# Patient Record
Sex: Female | Born: 2000 | Race: Black or African American | Hispanic: No | Marital: Single | Smoking: Never smoker
Health system: Southern US, Community
[De-identification: ages and names within clinical notes are randomized; demographics above are authoritative.]

## PROBLEM LIST (undated history)

## (undated) DIAGNOSIS — F4310 Post-traumatic stress disorder, unspecified: Secondary | ICD-10-CM

## (undated) DIAGNOSIS — G971 Other reaction to spinal and lumbar puncture: Secondary | ICD-10-CM

## (undated) DIAGNOSIS — W3400XA Accidental discharge from unspecified firearms or gun, initial encounter: Secondary | ICD-10-CM

## (undated) DIAGNOSIS — F419 Anxiety disorder, unspecified: Secondary | ICD-10-CM

## (undated) DIAGNOSIS — Y249XXA Unspecified firearm discharge, undetermined intent, initial encounter: Secondary | ICD-10-CM
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
## 2019-06-11 ENCOUNTER — Emergency Department (HOSPITAL_COMMUNITY): Payer: Medicaid Other

## 2019-06-11 ENCOUNTER — Emergency Department (HOSPITAL_COMMUNITY)
Admission: EM | Admit: 2019-06-11 | Discharge: 2019-06-11 | Disposition: A | Discharge status: Home / Self Care | Payer: Medicaid Other | Attending: Emergency Medicine | Admitting: Emergency Medicine

## 2019-06-11 ENCOUNTER — Encounter (HOSPITAL_COMMUNITY): Payer: Self-pay

## 2019-06-11 DIAGNOSIS — Z20828 Contact with and (suspected) exposure to other viral communicable diseases: Secondary | ICD-10-CM

## 2019-06-11 DIAGNOSIS — U071 COVID-19: Secondary | ICD-10-CM | POA: Insufficient documentation

## 2019-06-11 DIAGNOSIS — R509 Fever, unspecified: Secondary | ICD-10-CM | POA: Diagnosis present

## 2019-06-11 DIAGNOSIS — Z20822 Contact with and (suspected) exposure to covid-19: Secondary | ICD-10-CM

## 2019-06-11 MED ORDER — ALBUTEROL SULFATE HFA 108 (90 BASE) MCG/ACT IN AERS
4.0000 | INHALATION_SPRAY | Freq: Once | RESPIRATORY_TRACT | Status: AC
  Administered 2019-06-11: 4 via RESPIRATORY_TRACT
  Filled 2019-06-11: qty 6.7

## 2019-06-11 NOTE — ED Triage Notes (Signed)
Pt presents with c/o cough, fever, chills, SOB, loss of taste, and generalized weakness x3 days. Pt states she has taken OTC medication without relief.

## 2019-06-11 NOTE — ED Provider Notes (Signed)
[facility] EMERGENCY DEPARTMENT Provider Note   CSN: [id number] Arrival date & time: 06/11/19  0231     History   Chief Complaint Chief Complaint  Patient presents with  . Fever  . Cough  . Headache    HPI Ariana Lawrence is a 18 y.o. female who presents to ED for 3-day history of cough productive with mucus, shortness of breath, chills, chest tightness, wheezing, loss of appetite and loss of taste. She reports generalized fatigue and body aches as well.  She has tried ibuprofen with no improvement in her symptoms.  States that she works at [employer] and have been numerous sick contacts that have tested positive for COVID-19.  She had a negative COVID-19 test in August. She denies any chest pain, leg swelling, hemoptysis, history of DVT or PE, abdominal pain, vomiting.     HPI  History reviewed. No pertinent past medical history.  There are no active problems to display for this patient.   Past Surgical History:  Procedure Laterality Date  . CESAREAN SECTION       OB History   No obstetric history on file.      Home Medications    Prior to Admission medications   Not on File    Family History No family history on file.  Social History Social History   Tobacco Use  . Smoking status: Never Smoker  Substance Use Topics  . Alcohol use: Not on file  . Drug use: Not on file     Allergies   Patient has no known allergies.   Review of Systems Review of Systems  Constitutional: Positive for chills and fatigue. Negative for fever.  Respiratory: Positive for chest tightness, shortness of breath and wheezing.   Musculoskeletal: Positive for myalgias.  Neurological: Negative for weakness and numbness.     Physical Exam Updated Vital Signs BP (!) 137/100   Pulse 98   Temp 100.2 F (37.9 C) (Oral)   Resp 20   LMP 05/26/2019 (Exact Date)   SpO2 100%   Physical Exam Vitals signs and nursing note reviewed.  Constitutional:       General: She is not in acute distress.    Appearance: She is well-developed. She is not diaphoretic.     Comments: Speaking in complete sentences without difficulty.  HENT:     Head: Normocephalic and atraumatic.  Eyes:     General: No scleral icterus.    Conjunctiva/sclera: Conjunctivae normal.  Neck:     Musculoskeletal: Normal range of motion.  Cardiovascular:     Rate and Rhythm: Normal rate and regular rhythm.     Heart sounds: Normal heart sounds.  Pulmonary:     Effort: Pulmonary effort is normal. No respiratory distress.     Breath sounds: Wheezing (Bilateral lower lung fields) present.  Skin:    Findings: No rash.  Neurological:     Mental Status: She is alert.      ED Treatments / Results  Labs (all labs ordered are listed, but only abnormal results are displayed) Labs Reviewed  NOVEL CORONAVIRUS, NAA (HOSP ORDER, SEND-OUT TO REF LAB; TAT 18-24 HRS)    EKG None  Radiology Dg Chest Portable 1 View  Result Date: 06/11/2019 CLINICAL DATA:  Cough shortness of breath. EXAM: PORTABLE CHEST 1 VIEW COMPARISON:  None FINDINGS: The heart size and mediastinal contours are within normal limits. Both lungs are clear. The visualized skeletal structures are unremarkable. IMPRESSION: No acute cardiopulmonary disease. Electronically Signed  By: Zetta Bills M.D.   On: 06/11/2019 09:00    Procedures Procedures (including critical care time)  Medications Ordered in ED Medications  albuterol (VENTOLIN HFA) 108 (90 Base) MCG/ACT inhaler 4 puff (4 puffs Inhalation Given 06/11/19 9147)     Initial Impression / Assessment and Plan / ED Course  I have reviewed the triage vital signs and the nursing notes.  Pertinent labs & imaging results that were available during my care of the patient were reviewed by me and considered in my medical decision making (see chart for details).        ANIJA Lawrence was evaluated in Emergency Department on 06/11/19  for the symptoms  described in the history of present illness. He/she was evaluated in the context of the global COVID-19 pandemic, which necessitated consideration that the patient might be at risk for infection with the SARS-CoV-2 virus that causes COVID-19. Institutional protocols and algorithms that pertain to the evaluation of patients at risk for COVID-19 are in a state of rapid change based on information released by regulatory bodies including the CDC and federal and state organizations. These policies and algorithms were followed during the patient's care in the ED.  18 year old female presents to ED for 3-day history of cough productive with mucus, shortness of breath, chills, chest tightness, wheezing, loss of appetite and taste, fatigue and body aches.  Sick contacts with similar symptoms have tested positive for COVID-19.  Denies any chest pain, leg swelling, history of DVT or PE.  On exam patient is overall well-appearing.  Wheezing noted throughout bilateral lung fields without signs of respiratory distress.  She is speaking complete sentences without difficulty.  Vital signs within normal limits, she is not tachycardic, tachypneic or hypoxic.  Oral temp is 100.2 here.  She was given albuterol inhaler.  X-ray is unremarkable.  Covid test is pending.  Will encourage hydration, inhaler use, NSAIDs and Tylenol as needed to help with symptoms.  Patient is hemodynamically stable, in NAD, and able to ambulate in the ED. Evaluation does not show pathology that would require ongoing emergent intervention or inpatient treatment. I explained the diagnosis to the patient. Pain has been managed and has no complaints prior to discharge. Patient is comfortable with above plan and is stable for discharge at this time. All questions were answered prior to disposition. Strict return precautions for returning to the ED were discussed. Encouraged follow up with PCP.   An After Visit Summary was printed and given to the  patient.   Portions of this note were generated with [employer]. Dictation errors may occur despite best attempts at proofreading.   Final Clinical Impressions(s) / ED Diagnoses   Final diagnoses:  Close exposure to COVID-19 virus    ED Discharge Orders    None       Delia Heady, PA-C 06/11/19 8295    Maudie Flakes, MD 06/12/19 [phone]

## 2019-06-12 LAB — NOVEL CORONAVIRUS, NAA (HOSP ORDER, SEND-OUT TO REF LAB; TAT 18-24 HRS): SARS-CoV-2, NAA: DETECTED — AB

## 2019-11-05 ENCOUNTER — Other Ambulatory Visit (HOSPITAL_BASED_OUTPATIENT_CLINIC_OR_DEPARTMENT_OTHER): Payer: Self-pay

## 2019-11-05 DIAGNOSIS — G478 Other sleep disorders: Secondary | ICD-10-CM

## 2019-11-22 ENCOUNTER — Other Ambulatory Visit: Payer: Self-pay

## 2019-11-22 ENCOUNTER — Ambulatory Visit (INDEPENDENT_AMBULATORY_CARE_PROVIDER_SITE_OTHER): Payer: Medicaid Other | Admitting: Obstetrics and Gynecology

## 2019-11-22 ENCOUNTER — Encounter: Payer: Self-pay | Admitting: Obstetrics and Gynecology

## 2019-11-22 VITALS — BP 124/68 | HR 90 | Ht 73.0 in | Wt 308.0 lb

## 2019-11-22 DIAGNOSIS — N926 Irregular menstruation, unspecified: Secondary | ICD-10-CM

## 2019-11-22 NOTE — Progress Notes (Signed)
    GYNECOLOGY  ENCOUNTER NOTE  History:     Ariana Lawrence is a 19 y.o. Here with abnormal periods. This has been present for almost a year. G1P1; child is 2.5 y/o and she is interested in having another child. She has been trying to conceive for 5 months now. This is a different FOB. Spontaneous pregnancy with first child. C-section was the method of delivery. Says she will have a period  for 2 months in a row and then skip a period.  Periods are not heavy, periods last 4-5 days. Obesity is her sole medical condition.   Hx of chlamydia X 5.  Partner has no children.   Obstetric History OB History  No obstetric history on file.    No past medical history on file.  Past Surgical History:  Procedure Laterality Date  . CESAREAN SECTION      No current outpatient medications on file prior to visit.   No current facility-administered medications on file prior to visit.    No Known Allergies  Social History:  reports that she has never smoked. She does not have any smokeless tobacco history on file.  No family history on file.  The following portions of the patient's history were reviewed and updated as appropriate: allergies, current medications, past family history, past medical history, past social history, past surgical history and problem list.  Review of Systems Pertinent items noted in HPI and remainder of comprehensive ROS otherwise negative.  Physical Exam:  BP 124/68   Pulse 90   Ht 6\' 1"  (1.854 m)   Wt (!) 308 lb (139.7 kg)   LMP 10/09/2019 (Exact Date)   BMI 40.64 kg/m  CONSTITUTIONAL: Well-developed, well-nourished female in no acute distress.  HENT:  Normocephalic, atraumatic, External right and left ear normal. Oropharynx is clear and moist EYES: Conjunctivae and EOM are normal. Pupils are equal, round, and reactive to light. No scleral icterus.  NECK: Normal range of motion, supple, no masses.  Normal thyroid.  SKIN: Skin is warm and dry.  MUSCULOSKELETAL:  Normal range of motion. NEUROLOGIC: Alert and oriented to person, place, and time. Normal reflexes, muscle tone coordination.  PSYCHIATRIC: Normal mood and affect. Normal behavior. Normal judgment and thought content. CARDIOVASCULAR: Normal heart rate noted, regular rhythm RESPIRATORY: Clear to auscultation bilaterally. Effort and breath sounds normal, no problems with respiration noted.  Assessment and Plan:    A:  1. Abnormal menstrual periods  - Prolactin - FSH - TSH - HgB A1c - Testosterone, free - LH - Return in 2 weeks for f/u to discuss labs.   Jori Frerichs, 10/11/2019, NP [facility] [name], [facility]

## 2019-11-22 NOTE — Progress Notes (Signed)
Wants to get her cycles back on track to have another baby.  Cycles are:  05/26/2019-05/31/2019 07/02/2019-07/06/2019  08/04/2019-08/08/2019 No Cycle in Feb  10/09/2019-10/13/2019 Nothing for may late 2 days now per tracking app   [facility] stated to her she may have fibroids.

## 2019-11-23 LAB — HIV ANTIBODY (ROUTINE TESTING W REFLEX): HIV Screen 4th Generation wRfx: NONREACTIVE

## 2019-11-27 LAB — HEMOGLOBIN A1C
Est. average glucose Bld gHb Est-mCnc: 105 mg/dL
Hgb A1c MFr Bld: 5.3 % (ref 4.8–5.6)

## 2019-11-27 LAB — TSH: TSH: 1.54 u[IU]/mL (ref 0.450–4.500)

## 2019-11-27 LAB — TESTOSTERONE, FREE: Testosterone, Free: 5.2 pg/mL — ABNORMAL HIGH (ref 0.0–4.2)

## 2019-11-27 LAB — LUTEINIZING HORMONE: LH: 17.4 m[IU]/mL

## 2019-11-27 LAB — PROLACTIN: Prolactin: 9.8 ng/mL (ref 4.8–23.3)

## 2019-11-27 LAB — FOLLICLE STIMULATING HORMONE: FSH: 7.9 m[IU]/mL

## 2019-11-28 ENCOUNTER — Other Ambulatory Visit (HOSPITAL_COMMUNITY): Admission: RE | Admit: 2019-11-28 | Payer: Medicaid Other | Source: Ambulatory Visit

## 2019-11-30 ENCOUNTER — Ambulatory Visit (HOSPITAL_BASED_OUTPATIENT_CLINIC_OR_DEPARTMENT_OTHER): Payer: Medicaid Other | Admitting: Internal Medicine

## 2019-12-06 ENCOUNTER — Ambulatory Visit (INDEPENDENT_AMBULATORY_CARE_PROVIDER_SITE_OTHER): Payer: Medicaid Other | Admitting: Obstetrics and Gynecology

## 2019-12-06 ENCOUNTER — Encounter: Payer: Self-pay | Admitting: Obstetrics and Gynecology

## 2019-12-06 ENCOUNTER — Other Ambulatory Visit: Payer: Self-pay

## 2019-12-06 VITALS — BP 134/71 | HR 76 | Ht 73.0 in | Wt 309.0 lb

## 2019-12-06 DIAGNOSIS — Z6841 Body Mass Index (BMI) 40.0 and over, adult: Secondary | ICD-10-CM | POA: Diagnosis not present

## 2019-12-06 DIAGNOSIS — N926 Irregular menstruation, unspecified: Secondary | ICD-10-CM

## 2019-12-06 DIAGNOSIS — E282 Polycystic ovarian syndrome: Secondary | ICD-10-CM | POA: Diagnosis not present

## 2019-12-06 MED ORDER — MEDROXYPROGESTERONE ACETATE 10 MG PO TABS: 10.0000 mg | ORAL_TABLET | Freq: Every day | ORAL | 0 refills | Status: DC

## 2019-12-06 NOTE — Patient Instructions (Signed)
Polycystic Ovarian Syndrome  Polycystic ovarian syndrome (PCOS) is a common hormonal disorder among women of reproductive age. In most women with PCOS, many small fluid-filled sacs (cysts) grow on the ovaries, and the cysts are not part of a normal menstrual cycle. PCOS can cause problems with your menstrual periods and make it difficult to get pregnant. It can also cause an increased risk of miscarriage with pregnancy. If it is not treated, PCOS can lead to serious health problems, such as diabetes and heart disease. What are the causes? The cause of PCOS is not known, but it may be the result of a combination of certain factors, such as:  Irregular menstrual cycle.  High levels of certain hormones (androgens).  Problems with the hormone that helps to control blood sugar (insulin resistance).  Certain genes. What increases the risk? This condition is more likely to develop in women who have a family history of PCOS. What are the signs or symptoms? Symptoms of PCOS may include:  Multiple ovarian cysts.  Infrequent periods or no periods.  Periods that are too frequent or too heavy.  Unpredictable periods.  Inability to get pregnant (infertility) because of not ovulating.  Increased growth of hair on the face, chest, stomach, back, thumbs, thighs, or toes.  Acne or oily skin. Acne may develop during adulthood, and it may not respond to treatment.  Pelvic pain.  Weight gain or obesity.  Patches of thickened and dark brown or black skin on the neck, arms, breasts, or thighs (acanthosis nigricans).  Excess hair growth on the face, chest, abdomen, or upper thighs (hirsutism). How is this diagnosed? This condition is diagnosed based on:  Your medical history.  A physical exam, including a pelvic exam. Your health care provider may look for areas of increased hair growth on your skin.  Tests, such as: ? Ultrasound. This may be used to examine the ovaries and the lining of the  uterus (endometrium) for cysts. ? Blood tests. These may be used to check levels of sugar (glucose), female hormone (testosterone), and female hormones (estrogen and progesterone) in your blood. How is this treated? There is no cure for PCOS, but treatment can help to manage symptoms and prevent more health problems from developing. Treatment varies depending on:  Your symptoms.  Whether you want to have a baby or whether you need birth control (contraception). Treatment may include nutrition and lifestyle changes along with:  Progesterone hormone to start a menstrual period.  Birth control pills to help you have regular menstrual periods.  Medicines to make you ovulate, if you want to get pregnant.  Medicine to reduce excessive hair growth.  Surgery, in severe cases. This may involve making small holes in one or both of your ovaries. This decreases the amount of testosterone that your body produces. Follow these instructions at home:  Take over-the-counter and prescription medicines only as told by your health care provider.  Follow a healthy meal plan. This can help you reduce the effects of PCOS. ? Eat a healthy diet that includes lean proteins, complex carbohydrates, fresh fruits and vegetables, low-fat dairy products, and healthy fats. Make sure to eat enough fiber.  If you are overweight, lose weight as told by your health care provider. ? Losing 10% of your body weight may improve symptoms. ? Your health care provider can determine how much weight loss is best for you and can help you lose weight safely.  Keep all follow-up visits as told by your health care provider.   This is important. Contact a health care provider if:  Your symptoms do not get better with medicine.  You develop new symptoms. This information is not intended to replace advice given to you by your health care provider. Make sure you discuss any questions you have with your health care provider. Document  Revised: 06/10/2017 Document Reviewed: 12/14/2015 Elsevier Patient Education  2020 Elsevier Inc.  

## 2019-12-06 NOTE — Progress Notes (Signed)
    GYNECOLOGY ENCOUNTER NOTE  History:     Ariana Lawrence is a 19 y.o. here for f/u from recent lab work for abnormal menstrual cycle. Labs reviewed with patient.  Last period was March 30. Periods have been sporadic and irregular.  She has some abnormal hair growth on chin and face. Labs and symptoms consistent with PCOS.   LH: 17.4 Testosterone, Free: 5.2 A1C: 5.3 TSH: 1.54 FSH: 7.9 Prolactin: 9.8  Obstetric History OB History  No obstetric history on file.    No past medical history on file.  Past Surgical History:  Procedure Laterality Date  . CESAREAN SECTION      Current Outpatient Medications on File Prior to Visit  Medication Sig Dispense Refill  . Prenatal Vit-Fe Fumarate-FA (MULTIVITAMIN-PRENATAL) 27-0.8 MG TABS tablet Take 1 tablet by mouth daily at 12 noon.     No current facility-administered medications on file prior to visit.    No Known Allergies  Social History:  reports that she has never smoked. She has never used smokeless tobacco. She reports that she does not drink alcohol or use drugs.  No family history on file.  The following portions of the patient's history were reviewed and updated as appropriate: allergies, current medications, past family history, past medical history, past social history, past surgical history and problem list.  Review of Systems Pertinent items noted in HPI and remainder of comprehensive ROS otherwise negative.  Physical Exam:  BP 134/71   Pulse 76   Ht 6\' 1"  (1.854 m)   Wt (!) 309 lb (140.2 kg)   LMP 10/09/2019 (Exact Date)   BMI 40.77 kg/m  CONSTITUTIONAL: Well-developed, well-nourished female in no acute distress.  HENT:  Normocephalic, atraumatic, External right and left ear normal. Oropharynx is clear and moist EYES: Conjunctivae and EOM are normal. Pupils are equal, round, and reactive to light. No scleral icterus.  NECK: Normal range of motion, supple, no masses.  Normal thyroid.  SKIN: Skin is warm and  dry. No rash noted. Not diaphoretic. No erythema. No pallor. MUSCULOSKELETAL: Normal range of motion. No tenderness.  No cyanosis, clubbing, or edema.  2+ distal pulses. NEUROLOGIC: Alert and oriented to person, place, and time.  Assessment and Plan:   A:  Likely PCOS Elevated testosterone Obesity   P:  Provera 10 mg x 10 days  Discussed weight loss and healthy eating/exercise. Reviewed app on phone to help count calories and keep track of intake.  If no withdrawal bleed will refer to infertility.     Routine preventative health maintenance measures emphasized. Please refer to After Visit Summary for other counseling recommendations.    Ariana Lawrence, 10/11/2019, NP Ariana Lawrence, Ariana Lawrence

## 2020-01-07 ENCOUNTER — Encounter: Payer: Self-pay | Admitting: Obstetrics and Gynecology

## 2020-01-07 ENCOUNTER — Ambulatory Visit (INDEPENDENT_AMBULATORY_CARE_PROVIDER_SITE_OTHER): Payer: Medicaid Other | Admitting: Obstetrics and Gynecology

## 2020-01-07 ENCOUNTER — Other Ambulatory Visit: Payer: Self-pay

## 2020-01-07 VITALS — BP 133/81 | HR 74 | Wt 313.8 lb

## 2020-01-07 DIAGNOSIS — N926 Irregular menstruation, unspecified: Secondary | ICD-10-CM

## 2020-01-07 DIAGNOSIS — Z6841 Body Mass Index (BMI) 40.0 and over, adult: Secondary | ICD-10-CM

## 2020-01-07 DIAGNOSIS — E282 Polycystic ovarian syndrome: Secondary | ICD-10-CM

## 2020-01-07 DIAGNOSIS — E66813 Obesity, class 3: Secondary | ICD-10-CM

## 2020-01-07 NOTE — Progress Notes (Signed)
    GYNECOLOGY ENCOUNTER NOTE  History:     Ariana Lawrence is a 19 y.o. G65P2001 female here for follow up from previous visit for abnormal menstrual cycles, PCOS, and obeseity. The patient desires another pregnancy. She was given provera X 10 days at previous visit. She had a withdrawal bleeding from 6/8-6/11.  She is continuing to follow a weight loss program.She is doing [employer] bites on her iphone which is keeping her accountable for caloric intake. She is determined to lose weight.   Gynecologic History Patient's last menstrual period was 12/18/2019 (exact date). Contraception: none  Obstetric History OB History  Gravida Para Term Preterm AB Living  2 2 2  0   1  SAB TAB Ectopic Multiple Live Births  0 0 0 0 1    # Outcome Date GA Lbr Len/2nd Weight Sex Delivery Anes PTL Lv  2 Term 05/10/16          1 Term             No past medical history on file.  Past Surgical History:  Procedure Laterality Date  . CESAREAN SECTION      Current Outpatient Medications on File Prior to Visit  Medication Sig Dispense Refill  . cholecalciferol (VITAMIN D3) 25 MCG (1000 UNIT) tablet Take 1,000 Units by mouth daily.    . Prenatal Vit-Fe Fumarate-FA (MULTIVITAMIN-PRENATAL) 27-0.8 MG TABS tablet Take 1 tablet by mouth daily at 12 noon.     No current facility-administered medications on file prior to visit.    No Known Allergies  Social History:  reports that she has never smoked. She has never used smokeless tobacco. She reports that she does not drink alcohol and does not use drugs.  No family history on file.  The following portions of the patient's history were reviewed and updated as appropriate: allergies, current medications, past family history, past medical history, past social history, past surgical history and problem list.  Review of Systems Pertinent items noted in HPI and remainder of comprehensive ROS otherwise negative.  Physical Exam:  BP 133/81   Pulse 74   Wt (!)  313 lb 12.8 oz (142.3 kg)   LMP 12/18/2019 (Exact Date)   BMI 41.40 kg/m  CONSTITUTIONAL: Well-developed, well-nourished female in no acute distress.  HENT:  Normocephalic EYES: Conjunctivae and EOM are normal.  MUSCULOSKELETAL: Normal range of motion. NEUROLOGIC: Alert and oriented to person, place, and time.   PSYCHIATRIC: Normal mood and affect. Normal behavior.   Assessment and Plan:   1. Abnormal menstrual periods  Had withdrawal bleed with provera   2. Class 3 severe obesity without serious comorbidity with body mass index (BMI) of 40.0 to 44.9 in adult, unspecified obesity type [facility])  Information given to [facility] weight and wellness.  Continue weight loss plan.   3. PCOS (polycystic ovarian syndrome)  BMI 41 Weight increased from 308 to 313 F/u in 3-6 months. Would consider glucose testing and trial of metformin. Encouraged weight loss as primary goal. Weight goal 300 lbs at this time.    Ariana Lawrence, [facility], NP [facility] for Ariana Lawrence, [facility]

## 2020-01-07 NOTE — Patient Instructions (Signed)
Ariana Lawrence tel:[phone]

## 2020-03-09 ENCOUNTER — Encounter (HOSPITAL_COMMUNITY): Payer: Self-pay

## 2020-03-09 ENCOUNTER — Other Ambulatory Visit: Payer: Self-pay

## 2020-03-09 ENCOUNTER — Ambulatory Visit (HOSPITAL_COMMUNITY)
Admission: EM | Admit: 2020-03-09 | Discharge: 2020-03-09 | Disposition: A | Discharge status: Home / Self Care | Payer: Medicaid Other | Attending: Family Medicine | Admitting: Family Medicine

## 2020-03-09 DIAGNOSIS — J029 Acute pharyngitis, unspecified: Secondary | ICD-10-CM | POA: Diagnosis present

## 2020-03-09 DIAGNOSIS — Z79899 Other long term (current) drug therapy: Secondary | ICD-10-CM | POA: Insufficient documentation

## 2020-03-09 DIAGNOSIS — Z20822 Contact with and (suspected) exposure to covid-19: Secondary | ICD-10-CM | POA: Insufficient documentation

## 2020-03-09 LAB — POCT RAPID STREP A, ED / UC: Streptococcus, Group A Screen (Direct): NEGATIVE

## 2020-03-09 LAB — SARS CORONAVIRUS 2 (TAT 6-24 HRS): SARS Coronavirus 2: NEGATIVE

## 2020-03-09 MED ORDER — AMOXICILLIN-POT CLAVULANATE 875-125 MG PO TABS: 1.0000 | ORAL_TABLET | Freq: Two times a day (BID) | ORAL | 0 refills | Status: DC

## 2020-03-09 MED ORDER — LORATADINE 10 MG PO TABS: 10.0000 mg | ORAL_TABLET | Freq: Every day | ORAL | 0 refills | Status: DC

## 2020-03-09 NOTE — ED Triage Notes (Signed)
Pt presents with complaints of sore throat x 2 weeks. Denies any fevers. Reports minimal relief with otc treatment. Requesting covid test.

## 2020-03-09 NOTE — ED Provider Notes (Signed)
[facility]    CSN: [id number] Arrival date & time: 03/09/20  1022      History   Chief Complaint Chief Complaint  Patient presents with  . Sore    HPI Ariana Lawrence is a 19 y.o. female.   HPI  Patient has a sore throat for 2 weeks.  Tonsils are swollen.  Painful with swallowing.  No runny nose or stuffy nose or coughing.  No sweats chills or fever.  No headache or body aches.  No nausea or vomiting.  Does not usually have allergies or postnasal drip.  Denies history of acid reflux.  No known exposure to Covid.  No known exposure to strep  History reviewed. No pertinent past medical history.  There are no problems to display for this patient.   Past Surgical History:  Procedure Laterality Date  . CESAREAN SECTION      OB History    Gravida  2   Para  2   Term  2   Preterm  0   AB      Living  1     SAB  0   TAB  0   Ectopic  0   Multiple  0   Live Births  1            Home Medications    Prior to Admission medications   Medication Sig Start Date End Date Taking? Authorizing Provider  amoxicillin-clavulanate (AUGMENTIN) 875-125 MG tablet Take 1 tablet by mouth every 12 (twelve) hours. 03/09/20   Eustace Moore, MD  cholecalciferol (VITAMIN D3) 25 MCG (1000 UNIT) tablet Take 1,000 Units by mouth daily.    [provider]  loratadine (CLARITIN) 10 MG tablet Take 1 tablet (10 mg total) by mouth daily. 03/09/20   Eustace Moore, MD  Prenatal Vit-Fe Fumarate-FA (MULTIVITAMIN-PRENATAL) 27-0.8 MG TABS tablet Take 1 tablet by mouth daily at 12 noon.    [provider]    Family History Family History  Problem Relation Age of Onset  . Healthy Mother   . Heart attack Father   . Hepatitis Father     Social History Social History   Tobacco Use  . Smoking status: Never Smoker  . Smokeless tobacco: Never Used  Substance Use Topics  . Alcohol use: Never  . Drug use: Never     Allergies   Patient has no  known allergies.   Review of Systems Review of Systems  See HPI Physical Exam Triage Vital Signs ED Triage Vitals  Enc Vitals Group     BP 03/09/20 1143 131/77     Pulse Rate 03/09/20 1143 69     Resp 03/09/20 1143 19     Temp 03/09/20 1143 98.1 F (36.7 C)     Temp src --      SpO2 03/09/20 1143 100 %     Weight --      Height --      Head Circumference --      Peak Flow --      Pain Score 03/09/20 1140 7     Pain Loc --      Pain Edu? --      Excl. in GC? --    No data found.  Updated Vital Signs BP 131/77   Pulse 69   Temp 98.1 F (36.7 C)   Resp 19   LMP 02/08/2020   SpO2 100%      Physical Exam Constitutional:  General: She is not in acute distress.    Appearance: She is well-developed.  HENT:     Head: Normocephalic and atraumatic.     Right Ear: Tympanic membrane and ear canal normal.     Left Ear: Tympanic membrane and ear canal normal.     Nose: Nose normal. No congestion.     Mouth/Throat:     Pharynx: Posterior oropharyngeal erythema present.     Comments: Tonsils are enlarged to 3+ bilaterally.  No exudate.  Moderate erythema.  Clear PND Eyes:     Conjunctiva/sclera: Conjunctivae normal.     Pupils: Pupils are equal, round, and reactive to light.  Neck:     Comments: Anterior cervical nodes.  No posterior Cardiovascular:     Rate and Rhythm: Normal rate.     Heart sounds: Normal heart sounds.  Pulmonary:     Effort: Pulmonary effort is normal. No respiratory distress.     Breath sounds: Normal breath sounds.  Musculoskeletal:        General: Normal range of motion.     Cervical back: Normal range of motion.  Lymphadenopathy:     Cervical: Cervical adenopathy present.  Skin:    General: Skin is warm and dry.  Neurological:     Mental Status: She is alert.  Psychiatric:        Behavior: Behavior normal.      UC Treatments / Results  Labs (all labs ordered are listed, but only abnormal results are displayed) Labs Reviewed   SARS CORONAVIRUS 2 (TAT 6-24 HRS)  CULTURE, GROUP A STREP [facility])  POCT RAPID STREP A, ED / UC    EKG   Radiology No results found.  Procedures Procedures (including critical care time)  Medications Ordered in UC Medications - No data to display  Initial Impression / Assessment and Plan / UC Course  I have reviewed the triage vital signs and the nursing notes.  Pertinent labs & imaging results that were available during my care of the patient were reviewed by me and considered in my medical decision making (see chart for details).     Concern for appearance of tonsils with 2 weeks of sore throat.  Even though strep test is negative and will treat her with Augmentin.  Advise a probiotic while on Augmentin.  Pain management discussed.  I am giving her Claritin because I did see some clear PND.  Return as needed Final Clinical Impressions(s) / UC Diagnoses   Final diagnoses:  Sore throat     Discharge Instructions     Most sore throats are caused by a virus.  A virus should be gone within 10 days.  Because your sore throat is persisting I am going to treat you with antibiotics. Take the antibiotic 1 pill 2 times a day I recommend you take a probiotic while taking antibiotics to protect your stomach I have also prescribed Claritin to take once a day.  This will prevent any postnasal drip that irritates your throat Return if not improved in several days Check my chart for your throat culture and Covid test results    ED Prescriptions    Medication Sig Dispense Auth. Provider   loratadine (CLARITIN) 10 MG tablet Take 1 tablet (10 mg total) by mouth daily. 30 tablet Eustace Moore, MD   amoxicillin-clavulanate (AUGMENTIN) 875-125 MG tablet Take 1 tablet by mouth every 12 (twelve) hours. 14 tablet Eustace Moore, MD     PDMP not reviewed this encounter.  Eustace Moore, MD 03/09/20 [phone]

## 2020-03-09 NOTE — Discharge Instructions (Addendum)
Most sore throats are caused by a virus.  A virus should be gone within 10 days.  Because your sore throat is persisting I am going to treat you with antibiotics. Take the antibiotic 1 pill 2 times a day I recommend you take a probiotic while taking antibiotics to protect your stomach I have also prescribed Claritin to take once a day.  This will prevent any postnasal drip that irritates your throat Return if not improved in several days Check my chart for your throat culture and Covid test results

## 2020-03-11 LAB — CULTURE, GROUP A STREP (THRC)

## 2020-04-11 ENCOUNTER — Ambulatory Visit (HOSPITAL_COMMUNITY)
Admission: EM | Admit: 2020-04-11 | Discharge: 2020-04-11 | Disposition: A | Discharge status: Home / Self Care | Payer: Medicaid Other | Attending: Family Medicine | Admitting: Family Medicine

## 2020-04-11 ENCOUNTER — Encounter (HOSPITAL_COMMUNITY): Payer: Self-pay

## 2020-04-11 ENCOUNTER — Other Ambulatory Visit: Payer: Self-pay

## 2020-04-11 DIAGNOSIS — R109 Unspecified abdominal pain: Secondary | ICD-10-CM

## 2020-04-11 DIAGNOSIS — R110 Nausea: Secondary | ICD-10-CM | POA: Insufficient documentation

## 2020-04-11 DIAGNOSIS — R1013 Epigastric pain: Secondary | ICD-10-CM | POA: Insufficient documentation

## 2020-04-11 DIAGNOSIS — Z3202 Encounter for pregnancy test, result negative: Secondary | ICD-10-CM | POA: Diagnosis not present

## 2020-04-11 DIAGNOSIS — R197 Diarrhea, unspecified: Secondary | ICD-10-CM | POA: Insufficient documentation

## 2020-04-11 LAB — POCT URINALYSIS DIPSTICK, ED / UC
Bilirubin Urine: NEGATIVE
Glucose, UA: NEGATIVE mg/dL
Hgb urine dipstick: NEGATIVE
Ketones, ur: NEGATIVE mg/dL
Leukocytes,Ua: NEGATIVE
Nitrite: NEGATIVE
Protein, ur: NEGATIVE mg/dL
Specific Gravity, Urine: >=1.03 (ref 1.005–1.030)
Urobilinogen, UA: 0.2 mg/dL (ref 0.0–1.0)
pH: 5.5 (ref 5.0–8.0)

## 2020-04-11 LAB — POC URINE PREG, ED: Preg Test, Ur: NEGATIVE

## 2020-04-11 MED ORDER — ESOMEPRAZOLE MAGNESIUM 40 MG PO CPDR: 40.0000 mg | DELAYED_RELEASE_CAPSULE | Freq: Every day | ORAL | 0 refills | Status: DC

## 2020-04-11 MED ORDER — ONDANSETRON 4 MG PO TBDP: 4.0000 mg | ORAL_TABLET | Freq: Three times a day (TID) | ORAL | 0 refills | Status: DC | PRN

## 2020-04-11 NOTE — ED Triage Notes (Signed)
Pt c/o cramps in abdomen, diarrhea, and reflux that started approx 2 weeks ago after she ate seafood. Patient feels as though she has to vomit but is not able to. C/o medial tight chest pain that started on Tuesday. Pt tried BRAT diet to help bulk up stools Pt also reports taking stool softeners, last dose on Tuesday Has been having difficulty with urinating for almost 4 days with no other sx Denies sob, congestion, runny nose, congestion, fever Took augmentin in august for possible sore throat

## 2020-04-11 NOTE — ED Provider Notes (Addendum)
[facility]   [id number] 04/11/20 Arrival Time: 1039  ASSESSMENT & PLAN:  1. Dyspepsia   2. Nausea   3. Diarrhea, unspecified type     Stool sample collected for PCR.  Meds ordered this encounter  Medications  . esomeprazole (NEXIUM) 40 MG capsule    Sig: Take 1 capsule (40 mg total) by mouth daily.    Dispense:  30 capsule    Refill:  0  . ondansetron (ZOFRAN-ODT) 4 MG disintegrating tablet    Sig: Take 1 tablet (4 mg total) by mouth every 8 (eight) hours as needed for nausea or vomiting.    Dispense:  15 tablet    Refill:  0    Will do her best to ensure adequate fluid intake in order to avoid dehydration. Will proceed to the Emergency Department for evaluation if unable to tolerate PO fluids regularly.  Otherwise she will f/u with her PCP or here if not showing improvement over the next 48-72 hours.  Reviewed expectations re: course of current medical issues. Questions answered. Outlined signs and symptoms indicating need for more acute intervention. Patient verbalized understanding. After Visit Summary given.   SUBJECTIVE: History from: patient.  Ariana Lawrence is a 19 y.o. female who presents with complaint of non-bilious, non-bloody intermittent nausea with non-bloody diarrhea. Onset a week ago. Abdominal discomfort: mild and cramping. Symptoms are stable since beginning. Aggravating factors: none. Alleviating factors: none. Reports acid reflux, belching more often. Associated symptoms: fatigue; midl. She denies dysuria and fever. Appetite: mostly normal except for when nausea is present. PO intake: normal. Ambulatory without assistance. Urinary symptoms: none. Sick contacts: none. Recent travel or camping: none. OTC treatment: none.  Patient's last menstrual period was 03/24/2020 (exact date).  Past Surgical History:  Procedure Laterality Date  . CESAREAN SECTION       OBJECTIVE:  Vitals:   04/11/20 1312 04/11/20 1313  BP: 136/80   Pulse: 85    Resp: 19   Temp: 98.5 F (36.9 C)   TempSrc: Oral   SpO2: 100% 100%    General appearance: alert; no distress Oropharynx: moist Lungs: speaks full sentences without difficulty; unlabored Heart: regular rate and rhythm Abdomen: soft; non-distended Extremities: no edema; symmetrical with no gross deformities Skin: warm; dry Neurologic: normal gait Psychological: alert and cooperative; normal mood and affect  Labs: Results for orders placed or performed during the hospital encounter of 04/11/20  POC Urinalysis dipstick  Result Value Ref Range   Glucose, UA NEGATIVE NEGATIVE mg/dL   Bilirubin Urine NEGATIVE NEGATIVE   Ketones, ur NEGATIVE NEGATIVE mg/dL   Specific Gravity, Urine >=1.030 1.005 - 1.030   Hgb urine dipstick NEGATIVE NEGATIVE   pH 5.5 5.0 - 8.0   Protein, ur NEGATIVE NEGATIVE mg/dL   Urobilinogen, UA 0.2 0.0 - 1.0 mg/dL   Nitrite NEGATIVE NEGATIVE   Leukocytes,Ua NEGATIVE NEGATIVE  POC urine pregnancy  Result Value Ref Range   Preg Test, Ur NEGATIVE NEGATIVE     No Known Allergies                                             History reviewed. No pertinent past medical history.   Social History   Socioeconomic History  . Marital status: Single    Spouse name: Not on file  . Number of children: Not on file  . Years of education:  Not on file  . Highest education level: Not on file  Occupational History  . Not on file  Tobacco Use  . Smoking status: Never Smoker  . Smokeless tobacco: Never Used  Substance and Sexual Activity  . Alcohol use: Never  . Drug use: Never  . Sexual activity: Not on file  Other Topics Concern  . Not on file  Social History Narrative  . Not on file   Social Determinants of Health   Financial Resource Strain:   . Difficulty of Paying Living Expenses: Not on file  Food Insecurity: Food Insecurity Present  . Worried About Programme researcher, broadcasting/film/video in the Last Year: Sometimes true  . Ran Out of Food in the Last Year: Sometimes  true  Transportation Needs: No Transportation Needs  . Lack of Transportation (Medical): No  . Lack of Transportation (Non-Medical): No  Physical Activity:   . Days of Exercise per Week: Not on file  . Minutes of Exercise per Session: Not on file  Stress:   . Feeling of Stress : Not on file  Social Connections:   . Frequency of Communication with Friends and Family: Not on file  . Frequency of Social Gatherings with Friends and Family: Not on file  . Attends Religious Services: Not on file  . Active Member of Clubs or Organizations: Not on file  . Attends Banker Meetings: Not on file  . Marital Status: Not on file  Intimate Partner Violence:   . Fear of Current or Ex-Partner: Not on file  . Emotionally Abused: Not on file  . Physically Abused: Not on file  . Sexually Abused: Not on file   Family History  Problem Relation Age of Onset  . Healthy Mother   . Heart attack Father   . Hepatitis Father      Mardella Layman, MD 04/11/20 1531    Mardella Layman, MD 04/11/20 [phone]

## 2020-04-11 NOTE — Discharge Instructions (Signed)
Please do your best to ensure adequate fluid intake in order to avoid dehydration. If you find that you are unable to tolerate drinking fluids regularly please proceed to the Emergency Department for evaluation.  Also, you should return to the hospital if you experience persistent fevers for greater than 1-2 days, increasing abdominal pain that persists despite medications, persistent diarrhea, dizziness, syncope (fainting), or for any other concerns you may deem worrisome.

## 2020-04-12 LAB — GASTROINTESTINAL PANEL BY PCR, STOOL (REPLACES STOOL CULTURE)

## 2020-04-28 ENCOUNTER — Encounter (HOSPITAL_COMMUNITY): Admission: EM | Disposition: A | Discharge status: Home / Self Care | Payer: Self-pay | Source: Home / Self Care

## 2020-04-28 ENCOUNTER — Emergency Department (HOSPITAL_COMMUNITY): Payer: Medicaid Other

## 2020-04-28 ENCOUNTER — Inpatient Hospital Stay (HOSPITAL_COMMUNITY): Payer: Medicaid Other | Admitting: Certified Registered"

## 2020-04-28 ENCOUNTER — Emergency Department (HOSPITAL_COMMUNITY): Payer: Medicaid Other | Admitting: Anesthesiology

## 2020-04-28 ENCOUNTER — Encounter (HOSPITAL_COMMUNITY): Payer: Self-pay | Admitting: Certified Registered Nurse Anesthetist

## 2020-04-28 ENCOUNTER — Inpatient Hospital Stay (HOSPITAL_COMMUNITY)
Admission: EM | Admit: 2020-04-28 | Discharge: 2020-05-03 | DRG: 140 | Disposition: A | Discharge status: Home / Self Care | Payer: Medicaid Other | Attending: General Surgery | Admitting: General Surgery

## 2020-04-28 ENCOUNTER — Encounter (HOSPITAL_COMMUNITY): Payer: Self-pay

## 2020-04-28 ENCOUNTER — Inpatient Hospital Stay (HOSPITAL_COMMUNITY): Payer: Medicaid Other

## 2020-04-28 DIAGNOSIS — W3400XA Accidental discharge from unspecified firearms or gun, initial encounter: Secondary | ICD-10-CM

## 2020-04-28 DIAGNOSIS — S0292XB Unspecified fracture of facial bones, initial encounter for open fracture: Secondary | ICD-10-CM

## 2020-04-28 DIAGNOSIS — T07XXXA Unspecified multiple injuries, initial encounter: Secondary | ICD-10-CM

## 2020-04-28 DIAGNOSIS — T1490XA Injury, unspecified, initial encounter: Secondary | ICD-10-CM

## 2020-04-28 DIAGNOSIS — Z4659 Encounter for fitting and adjustment of other gastrointestinal appliance and device: Secondary | ICD-10-CM

## 2020-04-28 DIAGNOSIS — S1093XA Contusion of unspecified part of neck, initial encounter: Secondary | ICD-10-CM

## 2020-04-28 DIAGNOSIS — Z9289 Personal history of other medical treatment: Secondary | ICD-10-CM

## 2020-04-28 DIAGNOSIS — Z23 Encounter for immunization: Secondary | ICD-10-CM

## 2020-04-28 DIAGNOSIS — Z9911 Dependence on respirator [ventilator] status: Secondary | ICD-10-CM | POA: Diagnosis not present

## 2020-04-28 DIAGNOSIS — R200 Anesthesia of skin: Secondary | ICD-10-CM | POA: Diagnosis present

## 2020-04-28 DIAGNOSIS — Y249XXA Unspecified firearm discharge, undetermined intent, initial encounter: Secondary | ICD-10-CM

## 2020-04-28 DIAGNOSIS — J9690 Respiratory failure, unspecified, unspecified whether with hypoxia or hypercapnia: Secondary | ICD-10-CM | POA: Diagnosis present

## 2020-04-28 DIAGNOSIS — S27329A Contusion of lung, unspecified, initial encounter: Secondary | ICD-10-CM | POA: Diagnosis present

## 2020-04-28 DIAGNOSIS — Z20822 Contact with and (suspected) exposure to covid-19: Secondary | ICD-10-CM | POA: Diagnosis present

## 2020-04-28 DIAGNOSIS — Z6841 Body Mass Index (BMI) 40.0 and over, adult: Secondary | ICD-10-CM | POA: Diagnosis not present

## 2020-04-28 DIAGNOSIS — S02601B Fracture of unspecified part of body of right mandible, initial encounter for open fracture: Secondary | ICD-10-CM | POA: Diagnosis present

## 2020-04-28 DIAGNOSIS — S01512A Laceration without foreign body of oral cavity, initial encounter: Secondary | ICD-10-CM | POA: Diagnosis present

## 2020-04-28 DIAGNOSIS — R600 Localized edema: Secondary | ICD-10-CM | POA: Diagnosis present

## 2020-04-28 HISTORY — PX: ORIF FACIAL FRACTURE: SHX2118

## 2020-04-28 LAB — I-STAT CHEM 8, ED
BUN: 10 mg/dL (ref 6–20)
Calcium, Ion: 1.17 mmol/L (ref 1.15–1.40)
Chloride: 105 mmol/L (ref 98–111)
Creatinine, Ser: 0.9 mg/dL (ref 0.44–1.00)
Glucose, Bld: 117 mg/dL — ABNORMAL HIGH (ref 70–99)
HCT: 38 % (ref 36.0–46.0)
Hemoglobin: 12.9 g/dL (ref 12.0–15.0)
Potassium: 4 mmol/L (ref 3.5–5.1)
Sodium: 139 mmol/L (ref 135–145)
TCO2: 23 mmol/L (ref 22–32)

## 2020-04-28 LAB — CBC
HCT: 40.1 % (ref 36.0–46.0)
Hemoglobin: 12.7 g/dL (ref 12.0–15.0)
MCH: 28.7 pg (ref 26.0–34.0)
MCHC: 31.7 g/dL (ref 30.0–36.0)
MCV: 90.5 fL (ref 80.0–100.0)
Platelets: 321 10*3/uL (ref 150–400)
RBC: 4.43 MIL/uL (ref 3.87–5.11)
RDW: 13 % (ref 11.5–15.5)
WBC: 17 10*3/uL — ABNORMAL HIGH (ref 4.0–10.5)
nRBC: 0 % (ref 0.0–0.2)

## 2020-04-28 LAB — URINALYSIS, ROUTINE W REFLEX MICROSCOPIC
Bilirubin Urine: NEGATIVE
Glucose, UA: NEGATIVE mg/dL
Hgb urine dipstick: NEGATIVE
Ketones, ur: NEGATIVE mg/dL
Leukocytes,Ua: NEGATIVE
Nitrite: NEGATIVE
Protein, ur: 30 mg/dL — AB
Specific Gravity, Urine: 1.045 — ABNORMAL HIGH (ref 1.005–1.030)
pH: 6 (ref 5.0–8.0)

## 2020-04-28 LAB — COMPREHENSIVE METABOLIC PANEL
ALT: 17 U/L (ref 0–44)
AST: 19 U/L (ref 15–41)
Albumin: 3.5 g/dL (ref 3.5–5.0)
Alkaline Phosphatase: 63 U/L (ref 38–126)
Anion gap: 11 (ref 5–15)
BUN: 9 mg/dL (ref 6–20)
CO2: 20 mmol/L — ABNORMAL LOW (ref 22–32)
Calcium: 8.9 mg/dL (ref 8.9–10.3)
Chloride: 105 mmol/L (ref 98–111)
Creatinine, Ser: 0.96 mg/dL (ref 0.44–1.00)
GFR, Estimated: >60 mL/min (ref >60)
Glucose, Bld: 119 mg/dL — ABNORMAL HIGH (ref 70–99)
Potassium: 4.1 mmol/L (ref 3.5–5.1)
Sodium: 136 mmol/L (ref 135–145)
Total Bilirubin: 0.7 mg/dL (ref 0.3–1.2)
Total Protein: 6.6 g/dL (ref 6.5–8.1)

## 2020-04-28 LAB — RESPIRATORY PANEL BY RT PCR (FLU A&B, COVID)
Influenza A by PCR: NEGATIVE
Influenza B by PCR: NEGATIVE
SARS Coronavirus 2 by RT PCR: NEGATIVE

## 2020-04-28 LAB — PREPARE PLATELET PHERESIS: Unit division: 0

## 2020-04-28 LAB — POCT I-STAT 7, (LYTES, BLD GAS, ICA,H+H)
Acid-Base Excess: 0 mmol/L (ref 0.0–2.0)
Bicarbonate: 23.8 mmol/L (ref 20.0–28.0)
Calcium, Ion: 1.18 mmol/L (ref 1.15–1.40)
HCT: 35 % — ABNORMAL LOW (ref 36.0–46.0)
Hemoglobin: 11.9 g/dL — ABNORMAL LOW (ref 12.0–15.0)
O2 Saturation: 100 %
Potassium: 3.6 mmol/L (ref 3.5–5.1)
Sodium: 139 mmol/L (ref 135–145)
TCO2: 25 mmol/L (ref 22–32)
pCO2 arterial: 35.7 mmHg (ref 32.0–48.0)
pH, Arterial: 7.432 (ref 7.350–7.450)
pO2, Arterial: 339 mmHg — ABNORMAL HIGH (ref 83.0–108.0)

## 2020-04-28 LAB — HIV ANTIBODY (ROUTINE TESTING W REFLEX): HIV Screen 4th Generation wRfx: NONREACTIVE

## 2020-04-28 LAB — BPAM PLATELET PHERESIS
Blood Product Expiration Date: 202110192359
ISSUE DATE / TIME: 202110181007
Unit Type and Rh: 6200

## 2020-04-28 LAB — ETHANOL: Alcohol, Ethyl (B): <10 mg/dL (ref <10)

## 2020-04-28 LAB — TRIGLYCERIDES: Triglycerides: 54 mg/dL (ref <150)

## 2020-04-28 LAB — I-STAT BETA HCG BLOOD, ED (MC, WL, AP ONLY): I-stat hCG, quantitative: <5 m[IU]/mL (ref <5)

## 2020-04-28 LAB — LACTIC ACID, PLASMA: Lactic Acid, Venous: 2.9 mmol/L (ref 0.5–1.9)

## 2020-04-28 LAB — MASSIVE TRANSFUSION PROTOCOL ORDER (BLOOD BANK NOTIFICATION)

## 2020-04-28 LAB — APTT: aPTT: 26 seconds (ref 24–36)

## 2020-04-28 LAB — PROTIME-INR
INR: 1 (ref 0.8–1.2)
Prothrombin Time: 13 seconds (ref 11.4–15.2)

## 2020-04-28 SURGERY — OPEN REDUCTION INTERNAL FIXATION (ORIF) MULTIPLE FACIAL FRACTURES
Anesthesia: Choice | Site: Face | Laterality: Bilateral

## 2020-04-28 SURGERY — EVACUATION HEMATOMA
Anesthesia: General

## 2020-04-28 MED ORDER — MIDAZOLAM HCL 2 MG/2ML IJ SOLN
INTRAMUSCULAR | Status: DC | PRN
  Administered 2020-04-28: 2 mg via INTRAVENOUS

## 2020-04-28 MED ORDER — FENTANYL CITRATE (PF) 250 MCG/5ML IJ SOLN
INTRAMUSCULAR | Status: AC
  Filled 2020-04-28: qty 5

## 2020-04-28 MED ORDER — MIDAZOLAM HCL 2 MG/2ML IJ SOLN
INTRAMUSCULAR | Status: AC
  Filled 2020-04-28: qty 2

## 2020-04-28 MED ORDER — DEXAMETHASONE SODIUM PHOSPHATE 10 MG/ML IJ SOLN
10.0000 mg | Freq: Four times a day (QID) | INTRAMUSCULAR | Status: DC
  Administered 2020-04-28 – 2020-05-01 (×12): 10 mg via INTRAVENOUS
  Filled 2020-04-28 (×12): qty 1

## 2020-04-28 MED ORDER — FENTANYL CITRATE (PF) 100 MCG/2ML IJ SOLN: 50.0000 ug | Freq: Once | INTRAMUSCULAR | Status: DC

## 2020-04-28 MED ORDER — CHLORHEXIDINE GLUCONATE CLOTH 2 % EX PADS
6.0000 | MEDICATED_PAD | Freq: Every day | CUTANEOUS | Status: DC
  Administered 2020-04-29 – 2020-05-01 (×3): 6 via TOPICAL

## 2020-04-28 MED ORDER — BACITRACIN ZINC 500 UNIT/GM EX OINT
TOPICAL_OINTMENT | CUTANEOUS | Status: AC
  Filled 2020-04-28: qty 28.35

## 2020-04-28 MED ORDER — DOCUSATE SODIUM 100 MG PO CAPS
100.0000 mg | ORAL_CAPSULE | Freq: Two times a day (BID) | ORAL | Status: DC
  Filled 2020-04-28 (×2): qty 1

## 2020-04-28 MED ORDER — PANTOPRAZOLE SODIUM 40 MG PO TBEC: 40.0000 mg | DELAYED_RELEASE_TABLET | Freq: Every day | ORAL | Status: DC

## 2020-04-28 MED ORDER — CHLORHEXIDINE GLUCONATE 0.12% ORAL RINSE (MEDLINE KIT)
15.0000 mL | Freq: Two times a day (BID) | OROMUCOSAL | Status: DC
  Administered 2020-04-28 – 2020-04-30 (×6): 15 mL via OROMUCOSAL

## 2020-04-28 MED ORDER — FENTANYL BOLUS VIA INFUSION: 50.0000 ug | INTRAVENOUS | Status: DC | PRN

## 2020-04-28 MED ORDER — LACTATED RINGERS IV SOLN: INTRAVENOUS | Status: DC

## 2020-04-28 MED ORDER — 0.9 % SODIUM CHLORIDE (POUR BTL) OPTIME
TOPICAL | Status: DC | PRN
  Administered 2020-04-28: 1000 mL

## 2020-04-28 MED ORDER — DEXAMETHASONE SODIUM PHOSPHATE 10 MG/ML IJ SOLN
INTRAMUSCULAR | Status: DC | PRN
  Administered 2020-04-28: 10 mg via INTRAVENOUS

## 2020-04-28 MED ORDER — DEXTROSE 5 % IV SOLN
3.0000 g | Freq: Once | INTRAVENOUS | Status: AC
  Administered 2020-04-28: 3 g via INTRAVENOUS
  Filled 2020-04-28 (×2): qty 3000

## 2020-04-28 MED ORDER — ROCURONIUM BROMIDE 10 MG/ML (PF) SYRINGE
PREFILLED_SYRINGE | INTRAVENOUS | Status: AC
  Filled 2020-04-28: qty 20

## 2020-04-28 MED ORDER — ETOMIDATE 2 MG/ML IV SOLN
INTRAVENOUS | Status: DC | PRN
  Administered 2020-04-28: 20 mg via INTRAVENOUS

## 2020-04-28 MED ORDER — PROPOFOL 1000 MG/100ML IV EMUL
INTRAVENOUS | Status: AC
  Filled 2020-04-28: qty 100

## 2020-04-28 MED ORDER — FENTANYL CITRATE (PF) 100 MCG/2ML IJ SOLN
INTRAMUSCULAR | Status: AC
  Filled 2020-04-28: qty 2

## 2020-04-28 MED ORDER — ENOXAPARIN SODIUM 30 MG/0.3ML ~~LOC~~ SOLN
30.0000 mg | Freq: Two times a day (BID) | SUBCUTANEOUS | Status: DC
  Administered 2020-04-29 – 2020-05-03 (×9): 30 mg via SUBCUTANEOUS
  Filled 2020-04-28 (×9): qty 0.3

## 2020-04-28 MED ORDER — ROCURONIUM BROMIDE 10 MG/ML (PF) SYRINGE
PREFILLED_SYRINGE | INTRAVENOUS | Status: DC | PRN
  Administered 2020-04-28: 70 mg via INTRAVENOUS
  Administered 2020-04-28: 50 mg via INTRAVENOUS
  Administered 2020-04-28: 30 mg via INTRAVENOUS

## 2020-04-28 MED ORDER — TETANUS-DIPHTH-ACELL PERTUSSIS 5-2.5-18.5 LF-MCG/0.5 IM SUSP
0.5000 mL | Freq: Once | INTRAMUSCULAR | Status: AC
  Administered 2020-04-28: 0.5 mL via INTRAMUSCULAR
  Filled 2020-04-28: qty 0.5

## 2020-04-28 MED ORDER — 0.9 % SODIUM CHLORIDE (POUR BTL) OPTIME
TOPICAL | Status: DC | PRN
  Administered 2020-04-28: 2000 mL

## 2020-04-28 MED ORDER — ROCURONIUM BROMIDE 50 MG/5ML IV SOLN
INTRAVENOUS | Status: DC | PRN
  Administered 2020-04-28: 100 mg via INTRAVENOUS

## 2020-04-28 MED ORDER — OXYCODONE HCL 5 MG PO TABS
5.0000 mg | ORAL_TABLET | ORAL | Status: DC | PRN
  Administered 2020-04-29 – 2020-05-01 (×6): 10 mg
  Filled 2020-04-28 (×6): qty 2

## 2020-04-28 MED ORDER — FENTANYL 2500MCG IN NS 250ML (10MCG/ML) PREMIX INFUSION: 50.0000 ug/h | INTRAVENOUS | Status: DC

## 2020-04-28 MED ORDER — MORPHINE SULFATE (PF) 4 MG/ML IV SOLN: 4.0000 mg | INTRAVENOUS | Status: DC | PRN

## 2020-04-28 MED ORDER — CEFAZOLIN SODIUM-DEXTROSE 1-4 GM/50ML-% IV SOLN: 1.0000 g | Freq: Once | INTRAVENOUS | Status: DC

## 2020-04-28 MED ORDER — CEFAZOLIN SODIUM-DEXTROSE 2-4 GM/100ML-% IV SOLN: 2.0000 g | Freq: Once | INTRAVENOUS | Status: DC

## 2020-04-28 MED ORDER — SUCCINYLCHOLINE CHLORIDE 20 MG/ML IJ SOLN
INTRAMUSCULAR | Status: DC | PRN
  Administered 2020-04-28: 150 mg via INTRAVENOUS

## 2020-04-28 MED ORDER — PROPOFOL 10 MG/ML IV BOLUS
INTRAVENOUS | Status: DC | PRN
  Administered 2020-04-28: 80 mg via INTRAVENOUS

## 2020-04-28 MED ORDER — LACTATED RINGERS IV SOLN: INTRAVENOUS | Status: DC | PRN

## 2020-04-28 MED ORDER — BSS IO SOLN
INTRAOCULAR | Status: AC
  Filled 2020-04-28: qty 15

## 2020-04-28 MED ORDER — PANTOPRAZOLE SODIUM 40 MG IV SOLR
40.0000 mg | Freq: Every day | INTRAVENOUS | Status: DC
  Administered 2020-04-28 – 2020-04-30 (×3): 40 mg via INTRAVENOUS
  Filled 2020-04-28 (×3): qty 40

## 2020-04-28 MED ORDER — FENTANYL CITRATE (PF) 250 MCG/5ML IJ SOLN
INTRAMUSCULAR | Status: DC | PRN
  Administered 2020-04-28 (×2): 50 ug via INTRAVENOUS

## 2020-04-28 MED ORDER — IOHEXOL 350 MG/ML SOLN
100.0000 mL | Freq: Once | INTRAVENOUS | Status: AC | PRN
  Administered 2020-04-28: 100 mL via INTRAVENOUS

## 2020-04-28 MED ORDER — OXYMETAZOLINE HCL 0.05 % NA SOLN
NASAL | Status: AC
  Filled 2020-04-28: qty 30

## 2020-04-28 MED ORDER — BACITRACIN ZINC 500 UNIT/GM EX OINT
TOPICAL_OINTMENT | CUTANEOUS | Status: DC | PRN
  Administered 2020-04-28: 1 via TOPICAL

## 2020-04-28 MED ORDER — PROPOFOL 10 MG/ML IV BOLUS
INTRAVENOUS | Status: AC
  Filled 2020-04-28: qty 20

## 2020-04-28 MED ORDER — FENTANYL BOLUS VIA INFUSION
50.0000 ug | INTRAVENOUS | Status: DC | PRN
  Filled 2020-04-28: qty 50

## 2020-04-28 MED ORDER — SODIUM CHLORIDE 0.9 % IV SOLN
3.0000 g | Freq: Once | INTRAVENOUS | Status: AC
  Administered 2020-04-28: 3 g via INTRAVENOUS
  Filled 2020-04-28: qty 3
  Filled 2020-04-28: qty 8

## 2020-04-28 MED ORDER — ACETAMINOPHEN 500 MG PO TABS
1000.0000 mg | ORAL_TABLET | Freq: Four times a day (QID) | ORAL | Status: DC
  Administered 2020-04-29 – 2020-05-01 (×9): 1000 mg
  Filled 2020-04-28 (×10): qty 2

## 2020-04-28 MED ORDER — FENTANYL 2500MCG IN NS 250ML (10MCG/ML) PREMIX INFUSION
50.0000 ug/h | INTRAVENOUS | Status: DC
  Administered 2020-04-28: 200 ug/h via INTRAVENOUS
  Administered 2020-04-28 – 2020-04-30 (×3): 150 ug/h via INTRAVENOUS
  Filled 2020-04-28 (×4): qty 250

## 2020-04-28 MED ORDER — LIDOCAINE-EPINEPHRINE 1 %-1:100000 IJ SOLN
INTRAMUSCULAR | Status: AC
  Filled 2020-04-28: qty 1

## 2020-04-28 MED ORDER — PHENYLEPHRINE 40 MCG/ML (10ML) SYRINGE FOR IV PUSH (FOR BLOOD PRESSURE SUPPORT)
PREFILLED_SYRINGE | INTRAVENOUS | Status: DC | PRN
  Administered 2020-04-28 (×2): 80 ug via INTRAVENOUS

## 2020-04-28 MED ORDER — NOREPINEPHRINE 4 MG/250ML-% IV SOLN
INTRAVENOUS | Status: AC
  Filled 2020-04-28: qty 250

## 2020-04-28 MED ORDER — METHOCARBAMOL 500 MG PO TABS
1000.0000 mg | ORAL_TABLET | Freq: Three times a day (TID) | ORAL | Status: DC
  Administered 2020-04-29 – 2020-05-01 (×7): 1000 mg
  Filled 2020-04-28 (×8): qty 2

## 2020-04-28 MED ORDER — POLYETHYLENE GLYCOL 3350 17 G PO PACK
17.0000 g | PACK | Freq: Every day | ORAL | Status: DC
  Filled 2020-04-28: qty 1

## 2020-04-28 MED ORDER — ONDANSETRON 4 MG PO TBDP: 4.0000 mg | ORAL_TABLET | Freq: Four times a day (QID) | ORAL | Status: DC | PRN

## 2020-04-28 MED ORDER — ORAL CARE MOUTH RINSE
15.0000 mL | OROMUCOSAL | Status: DC
  Administered 2020-04-28 – 2020-04-30 (×22): 15 mL via OROMUCOSAL

## 2020-04-28 MED ORDER — OXYMETAZOLINE HCL 0.05 % NA SOLN
NASAL | Status: DC | PRN
  Administered 2020-04-28: 2 via NASAL

## 2020-04-28 MED ORDER — PROPOFOL 1000 MG/100ML IV EMUL
5.0000 ug/kg/min | INTRAVENOUS | Status: DC
  Administered 2020-04-28: 35 ug/kg/min via INTRAVENOUS
  Administered 2020-04-28: 50 ug/kg/min via INTRAVENOUS
  Administered 2020-04-28: 10 ug/kg/min via INTRAVENOUS
  Administered 2020-04-29: 25 ug/kg/min via INTRAVENOUS
  Administered 2020-04-29: 10 ug/kg/min via INTRAVENOUS
  Administered 2020-04-29: 40 ug/kg/min via INTRAVENOUS
  Administered 2020-04-30: 25 ug/kg/min via INTRAVENOUS
  Filled 2020-04-28 (×5): qty 100

## 2020-04-28 MED ORDER — ONDANSETRON HCL 4 MG/2ML IJ SOLN
4.0000 mg | Freq: Four times a day (QID) | INTRAMUSCULAR | Status: DC | PRN
  Administered 2020-05-01: 4 mg via INTRAVENOUS
  Filled 2020-04-28: qty 2

## 2020-04-28 SURGICAL SUPPLY — 52 items
BLADE SURG 15 STRL LF DISP TIS (BLADE) IMPLANT
BLADE SURG 15 STRL SS (BLADE)
CANISTER SUCT 3000ML PPV (MISCELLANEOUS) ×2 IMPLANT
CLEANER TIP ELECTROSURG 2X2 (MISCELLANEOUS) ×2 IMPLANT
COVER WAND RF STERILE (DRAPES) ×2 IMPLANT
DRAPE HALF SHEET 40X57 (DRAPES) IMPLANT
ELECT COATED BLADE 2.86 ST (ELECTRODE) ×2 IMPLANT
ELECT NEEDLE TIP 2.8 STRL (NEEDLE) IMPLANT
ELECT REM PT RETURN 9FT ADLT (ELECTROSURGICAL) ×2
ELECTRODE REM PT RTRN 9FT ADLT (ELECTROSURGICAL) ×1 IMPLANT
GLOVE ORTHO TXT STRL SZ7.5 (GLOVE) ×2 IMPLANT
GOWN STRL REUS W/ TWL LRG LVL3 (GOWN DISPOSABLE) ×2 IMPLANT
GOWN STRL REUS W/TWL LRG LVL3 (GOWN DISPOSABLE) ×4
KIT BASIN OR (CUSTOM PROCEDURE TRAY) ×2 IMPLANT
KIT TURNOVER KIT B (KITS) ×2 IMPLANT
NEEDLE PRECISIONGLIDE 27X1.5 (NEEDLE) ×2 IMPLANT
NS IRRIG 1000ML POUR BTL (IV SOLUTION) ×2 IMPLANT
PAD ARMBOARD 7.5X6 YLW CONV (MISCELLANEOUS) ×4 IMPLANT
PATTIES SURGICAL .5 X3 (DISPOSABLE) IMPLANT
PENCIL BUTTON HOLSTER BLD 10FT (ELECTRODE) ×2 IMPLANT
PROTECTOR CORNEAL (OPHTHALMIC RELATED) ×2 IMPLANT
SCISSORS WIRE DISP (INSTRUMENTS) ×2 IMPLANT
SCREW UPPER FACE 2.0X12MM (Screw) ×6 IMPLANT
SCREW UPPER FACE 2.0X8MM (Screw) ×8 IMPLANT
STAPLER VISISTAT 35W (STAPLE) ×2 IMPLANT
SUT BONE WAX W31G (SUTURE) IMPLANT
SUT CHROMIC 4 0 PS 2 18 (SUTURE) IMPLANT
SUT CHROMIC 5 0 RB 1 27 (SUTURE) IMPLANT
SUT ETHILON 4 0 P 3 18 (SUTURE) ×2 IMPLANT
SUT ETHILON 5 0 P 3 18 (SUTURE)
SUT NYLON ETHILON 5-0 P-3 1X18 (SUTURE) IMPLANT
SUT PLAIN 5 0 P 3 18 (SUTURE) IMPLANT
SUT PROLENE 4 0 PS 2 18 (SUTURE) ×2 IMPLANT
SUT PROLENE 4 0 RB 1 (SUTURE) ×2
SUT PROLENE 4-0 RB1 18X2 ARM (SUTURE) ×1 IMPLANT
SUT PROLENE 5 0 RB 2 (SUTURE) IMPLANT
SUT SILK 3 0 (SUTURE) ×2
SUT SILK 3-0 18XBRD TIE 12 (SUTURE) ×1 IMPLANT
SUT STEEL 0 (SUTURE) ×2
SUT STEEL 0 18XMFL TIE 17 (SUTURE) ×1 IMPLANT
SUT STEEL 2 (SUTURE) ×2 IMPLANT
SUT VIC AB 3-0 FS2 27 (SUTURE) IMPLANT
SUT VIC AB 3-0 SH 27 (SUTURE) ×2
SUT VIC AB 3-0 SH 27X BRD (SUTURE) ×1 IMPLANT
SUT VIC AB 4-0 P-3 18X BRD (SUTURE) ×1 IMPLANT
SUT VIC AB 4-0 P3 18 (SUTURE) ×2
SUT VIC AB 5-0 P-3 18XBRD (SUTURE) IMPLANT
SUT VIC AB 5-0 P3 18 (SUTURE)
SYR EAR/ULCER 2OZ (SYRINGE) ×2 IMPLANT
TOWEL GREEN STERILE FF (TOWEL DISPOSABLE) ×2 IMPLANT
TRAY ENT MC OR (CUSTOM PROCEDURE TRAY) ×2 IMPLANT
WATER STERILE IRR 1000ML POUR (IV SOLUTION) ×2 IMPLANT

## 2020-04-28 NOTE — Progress Notes (Signed)
Patient arrived to 4N20 from OR via bag mask valve nasal intubated. Placed on previous vent settings.

## 2020-04-28 NOTE — Anesthesia Procedure Notes (Signed)
Procedure Name: Intubation Date/Time: 04/28/2020 9:30 AM Performed by: Leonides Grills, MD Pre-anesthesia Checklist: Patient identified, Emergency Drugs available, Suction available and Patient being monitored Preoxygenation: Pre-oxygenation with 100% oxygen Ventilation: Mask ventilation without difficulty Laryngoscope Size: Glidescope and 4 Grade View: Grade I Tube type: Oral Tube size: 7.5 mm Number of attempts: 1 Airway Equipment and Method: Stylet and Video-laryngoscopy Placement Confirmation: ETT inserted through vocal cords under direct vision,  breath sounds checked- equal and bilateral and CO2 detector Secured at: 25 cm Tube secured with: Tape Dental Injury: Teeth and Oropharynx as per pre-operative assessment  Difficulty Due To: Difficulty was anticipated

## 2020-04-28 NOTE — ED Notes (Signed)
0930: Attempt intubation. Not successful.  6644: Patient being ventilated via BVM.

## 2020-04-28 NOTE — Anesthesia Preprocedure Evaluation (Addendum)
Anesthesia Evaluation  Patient identified by MRN, date of birth, ID band Patient awake    Reviewed: Allergy & Precautions, NPO status , Patient's Chart, lab work & pertinent test results, Unable to perform ROS - Chart review only  History of Anesthesia Complications Negative for: history of anesthetic complications  Airway Mallampati: Intubated       Dental   Pulmonary neg pulmonary ROS,  Covid-19 Nucleic Acid Test Results Lab Results      Component                Value               Date                      SARSCOV2NAA              NEGATIVE            04/28/2020              breath sounds clear to auscultation       Cardiovascular negative cardio ROS   Rhythm:Regular     Neuro/Psych negative neurological ROS  negative psych ROS   GI/Hepatic negative GI ROS, Neg liver ROS,   Endo/Other  Morbid obesity  Renal/GU negative Renal ROS     Musculoskeletal   Abdominal   Peds  Hematology  (+) anemia , Lab Results      Component                Value               Date                      WBC                      14.0 (H)            04/29/2020                HGB                      11.7 (L)            04/29/2020                HCT                      34.2 (L)            04/29/2020                MCV                      84.7                04/29/2020                PLT                      240                 04/29/2020              Anesthesia Other Findings   Reproductive/Obstetrics                             Anesthesia Physical Anesthesia Plan  ASA: II  Anesthesia Plan: General   Post-op Pain Management:    Induction: Intravenous  PONV Risk Score and Plan: 3 and Treatment may vary due to age or medical condition and Ondansetron  Airway Management Planned: Oral ETT, Nasal ETT, Fiberoptic Intubation Planned and Video Laryngoscope Planned  Additional Equipment: None  Intra-op  Plan:   Post-operative Plan: Post-operative intubation/ventilation  Informed Consent:     History available from chart only  Plan Discussed with: CRNA and Surgeon  Anesthesia Plan Comments:         Anesthesia Quick Evaluation

## 2020-04-28 NOTE — ED Notes (Signed)
RBC @0948  W2399 21 [id number] @0951  W2399 21  Plasma @1000  W2399 21 @1005  W2399 21 [id number]

## 2020-04-28 NOTE — Progress Notes (Signed)
   04/28/20 0916  Clinical Encounter Type  Visited With Other (Comment) (Patient being treated by medical team)  Visit Type Trauma  Referral From Nurse  Consult/Referral To Chaplain  Chaplain responded to level 1 trauma page. Patient being treated by medical team. No family present.This note was prepared by Deneen Harts, M.Div..  For questions please contact by phone [phone].

## 2020-04-28 NOTE — Progress Notes (Addendum)
CSW was present for the patient's arrival as a level 1 trauma.  Edwin Dada, MSW, LCSW-A Transitions of Care  Clinical Social Worker  [facility] Emergency Departments  Medical ICU [phone]

## 2020-04-28 NOTE — Progress Notes (Signed)
Orthopedic Tech Progress Note Patient Details:  Ariana Lawrence 07/03/2001 [id number] Level 1 Trauma. Not currently needed Patient ID: Ariana Lawrence, female   DOB: 2000-08-02, 19 y.o.   MRN: [id number]   [name] 04/28/2020, 10:08 AM
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
## 2020-04-28 NOTE — Progress Notes (Signed)
Earrings and nose ring given to Ariana Lawrence, Ariana Lawrence. No other belongings at bedside.  Visitation reviewed with Ariana Lawrence and sister, Ariana Lawrence, who will be the visitors. No other visitors allowed at this time.
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
## 2020-04-28 NOTE — ED Provider Notes (Signed)
  OG placement  Date/Time: 04/28/2020 10:05 AM Performed by: Liberty Handy, PA-C Authorized by: Liberty Handy, PA-C  Local anesthesia used: no  Anesthesia: Local anesthesia used: no  Sedation: Patient sedated: yes Sedatives: etomidate and see MAR for details Analgesia: see MAR for details (succs)   .Foreign Body Removal  Date/Time: 04/28/2020 10:06 AM Performed by: Liberty Handy, PA-C Authorized by: Liberty Handy, PA-C  Body area: skin General location: trunk Location details: chest Anesthesia: see MAR for details  Anesthesia: Local anesthetic: none, rapid sequence intubation.  Sedation: Patient sedated: yes Sedation type: (RSI)  Removal mechanism: scalpel Depth: subcutaneous Complexity: simple 1 objects recovered. Objects recovered: single one point dermal piercing removed after small skin incision to facilitate removal  Post-procedure assessment: foreign body removed .Foreign Body Removal  Date/Time: 04/28/2020 10:08 AM Performed by: Liberty Handy, PA-C Authorized by: Liberty Handy, PA-C  Body area: skin General location: trunk Location details: chest Anesthesia: see MAR for details  Sedation: Patient sedated: yes Sedation type: (RSI) Analgesia: see MAR for details  Depth: subcutaneous Complexity: simple Objects recovered: single two point screw on piercing removed from skin over sternum   Post-procedure assessment: foreign body removed       Liberty Handy, PA-C 04/28/20 1011    Terrilee Files, MD 04/28/20 1811
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
## 2020-04-28 NOTE — ED Notes (Signed)
Patient transported to CT 

## 2020-04-28 NOTE — ED Notes (Signed)
Attempt intubation at this time.

## 2020-04-28 NOTE — Anesthesia Procedure Notes (Signed)
Procedure Name: Intubation Date/Time: 04/28/2020 6:27 PM Performed by: Val Eagle, MD Pre-anesthesia Checklist: Patient identified, Emergency Drugs available, Suction available, Patient being monitored and Timeout performed Patient Re-evaluated:Patient Re-evaluated prior to induction Oxygen Delivery Method: Circle system utilized Preoxygenation: Pre-oxygenation with 100% oxygen Induction Type: IV induction Laryngoscope Size: Glidescope and 4 Grade View: Grade I Nasal Tubes: Left Tube size: 7.0 mm Number of attempts: 1 Airway Equipment and Method: Fiberoptic brochoscope Placement Confirmation: positive ETCO2 and breath sounds checked- equal and bilateral Secured at: 28 cm Tube secured with: Tape Dental Injury: Teeth and Oropharynx as per pre-operative assessment  Comments: Patient preoxygenated. Grade 1 view established with glidescope. 7.0 nasal tube passed through left nares and left in oropharynx. Disposable bronch passed through nasal tube with tip proximal to vocal cords. Exchange catheter passed through oral tube and ett withdrawn proximal to cords with exchange catheter across vocal cords. Bronch advanced through vocal cords and nasal tube advanced over bronch. Cuff inflated and test ventilation preformed before removal of exchange catheter. Exnchange catheter withdrawn with nasal tube balloon deflated. Tube secured.

## 2020-04-28 NOTE — Transfer of Care (Signed)
Immediate Anesthesia Transfer of Care Note  Patient: Ariana Lawrence  Procedure(s) Performed: OPEN REDUCTION INTERNAL FIXATION (ORIF) BILATERAL MANDIBLE FRACTURES, repair of cervical wound. (Bilateral Face)  Patient Location: ICU  Anesthesia Type:General  Level of Consciousness: Patient remains intubated per anesthesia plan  Airway & Oxygen Therapy: Patient remains intubated per anesthesia plan and Patient placed on Ventilator (see vital sign flow sheet for setting)  Post-op Assessment: Report given to RN and Post -op Vital signs reviewed and stable  Post vital signs: Reviewed and stable  Last Vitals:  Vitals Value Taken Time  BP 126/80   Temp    Pulse 84 04/28/20 2035  Resp 20 04/28/20 2035  SpO2 99 % 04/28/20 2035  Vitals shown include unvalidated device data.  Last Pain:  Vitals:   04/28/20 1200  TempSrc: Axillary     Report to [facility] in ICU, fentanyl gtt and propofol gtt maintained, RT at bedside, applied to ventilator, PRVC 540/22 5 PEEP 40%, full report given, nasal ETT tube maintained via left nare, secured at 28, taped in place, VSS, transfer of patient in safe and stable condition.    Complications: No complications documented.
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
## 2020-04-29 ENCOUNTER — Inpatient Hospital Stay (HOSPITAL_COMMUNITY): Payer: Medicaid Other

## 2020-04-29 ENCOUNTER — Encounter (HOSPITAL_COMMUNITY): Payer: Self-pay | Admitting: Oral Surgery

## 2020-04-29 LAB — BASIC METABOLIC PANEL
Anion gap: 11 (ref 5–15)
BUN: 7 mg/dL (ref 6–20)
CO2: 21 mmol/L — ABNORMAL LOW (ref 22–32)
Calcium: 8.7 mg/dL — ABNORMAL LOW (ref 8.9–10.3)
Chloride: 104 mmol/L (ref 98–111)
Creatinine, Ser: 0.81 mg/dL (ref 0.44–1.00)
GFR, Estimated: >60 mL/min (ref >60)
Glucose, Bld: 140 mg/dL — ABNORMAL HIGH (ref 70–99)
Potassium: 3.8 mmol/L (ref 3.5–5.1)
Sodium: 136 mmol/L (ref 135–145)

## 2020-04-29 LAB — BPAM FFP
Blood Product Expiration Date: 202110192359
Blood Product Expiration Date: 202110192359
Blood Product Expiration Date: 202110202359
Blood Product Expiration Date: 202110202359
Blood Product Expiration Date: 202110202359
Blood Product Expiration Date: 202110202359
Blood Product Expiration Date: 202110202359
Blood Product Expiration Date: 202110202359
Blood Product Expiration Date: 202110202359
Blood Product Expiration Date: 202110202359
ISSUE DATE / TIME: 202110180949
ISSUE DATE / TIME: 202110180949
ISSUE DATE / TIME: 202110180954
ISSUE DATE / TIME: 202110180954
ISSUE DATE / TIME: 202110180954
ISSUE DATE / TIME: 202110180954
ISSUE DATE / TIME: 202110181003
ISSUE DATE / TIME: 202110181003
ISSUE DATE / TIME: 202110181003
ISSUE DATE / TIME: 202110181003
Unit Type and Rh: 6200
Unit Type and Rh: 6200
Unit Type and Rh: 6200
Unit Type and Rh: 6200
Unit Type and Rh: 6200
Unit Type and Rh: 6200
Unit Type and Rh: 6200
Unit Type and Rh: 6200
Unit Type and Rh: 6200
Unit Type and Rh: 6200

## 2020-04-29 LAB — BPAM RBC
Blood Product Expiration Date: 202110252359
Blood Product Expiration Date: 202111012359
Blood Product Expiration Date: 202111012359
Blood Product Expiration Date: 202111012359
Blood Product Expiration Date: 202111022359
Blood Product Expiration Date: 202111042359
Blood Product Expiration Date: 202111042359
Blood Product Expiration Date: 202111042359
Blood Product Expiration Date: 202111042359
Blood Product Expiration Date: 202111042359
Blood Product Expiration Date: 202111042359
Blood Product Expiration Date: 202111052359
Blood Product Expiration Date: 202111052359
Blood Product Expiration Date: 202111102359
Blood Product Expiration Date: 202111192359
ISSUE DATE / TIME: 202110180941
ISSUE DATE / TIME: 202110180951
ISSUE DATE / TIME: 202110180951
ISSUE DATE / TIME: 202110180951
ISSUE DATE / TIME: 202110180951
ISSUE DATE / TIME: 202110181001
ISSUE DATE / TIME: 202110181001
ISSUE DATE / TIME: 202110181007
ISSUE DATE / TIME: 202110181007
ISSUE DATE / TIME: 202110181013
ISSUE DATE / TIME: 202110181013
ISSUE DATE / TIME: 202110181608
ISSUE DATE / TIME: 202110181948
ISSUE DATE / TIME: 202110182306
ISSUE DATE / TIME: 202110190102
Unit Type and Rh: 6200
Unit Type and Rh: 6200
Unit Type and Rh: 6200
Unit Type and Rh: 6200
Unit Type and Rh: 6200
Unit Type and Rh: 6200
Unit Type and Rh: 6200
Unit Type and Rh: 6200
Unit Type and Rh: 9500
Unit Type and Rh: 9500
Unit Type and Rh: 9500
Unit Type and Rh: 9500
Unit Type and Rh: 9500
Unit Type and Rh: 9500
Unit Type and Rh: 9500

## 2020-04-29 LAB — TYPE AND SCREEN
ABO/RH(D): A POS
Antibody Screen: NEGATIVE
Unit division: 0
Unit division: 0
Unit division: 0
Unit division: 0
Unit division: 0
Unit division: 0
Unit division: 0
Unit division: 0
Unit division: 0
Unit division: 0
Unit division: 0
Unit division: 0
Unit division: 0
Unit division: 0
Unit division: 0

## 2020-04-29 LAB — PREPARE FRESH FROZEN PLASMA
Unit division: 0
Unit division: 0
Unit division: 0
Unit division: 0
Unit division: 0
Unit division: 0
Unit division: 0
Unit division: 0

## 2020-04-29 LAB — BPAM CRYOPRECIPITATE
Blood Product Expiration Date: 202110181605
ISSUE DATE / TIME: 202110181022
Unit Type and Rh: 6200

## 2020-04-29 LAB — CBC
HCT: 34.2 % — ABNORMAL LOW (ref 36.0–46.0)
Hemoglobin: 11.7 g/dL — ABNORMAL LOW (ref 12.0–15.0)
MCH: 29 pg (ref 26.0–34.0)
MCHC: 34.2 g/dL (ref 30.0–36.0)
MCV: 84.7 fL (ref 80.0–100.0)
Platelets: 240 10*3/uL (ref 150–400)
RBC: 4.04 MIL/uL (ref 3.87–5.11)
RDW: 13.2 % (ref 11.5–15.5)
WBC: 14 10*3/uL — ABNORMAL HIGH (ref 4.0–10.5)
nRBC: 0 % (ref 0.0–0.2)

## 2020-04-29 LAB — PHOSPHORUS: Phosphorus: 3.5 mg/dL (ref 2.5–4.6)

## 2020-04-29 LAB — PREPARE CRYOPRECIPITATE: Unit division: 0

## 2020-04-29 LAB — MAGNESIUM: Magnesium: 1.7 mg/dL (ref 1.7–2.4)

## 2020-04-29 LAB — ABO/RH: ABO/RH(D): A POS

## 2020-04-29 LAB — BLOOD PRODUCT ORDER (VERBAL) VERIFICATION

## 2020-04-29 LAB — GLUCOSE, CAPILLARY: Glucose-Capillary: 142 mg/dL — ABNORMAL HIGH (ref 70–99)

## 2020-04-29 MED ORDER — VITAL 1.5 CAL PO LIQD
1000.0000 mL | ORAL | Status: DC
  Administered 2020-04-29 – 2020-05-01 (×3): 1000 mL
  Filled 2020-04-29: qty 1000

## 2020-04-29 MED ORDER — WHITE PETROLATUM EX OINT
TOPICAL_OINTMENT | CUTANEOUS | Status: AC
  Filled 2020-04-29: qty 28.35

## 2020-04-29 MED ORDER — SODIUM CHLORIDE 0.9 % IV SOLN
3.0000 g | Freq: Four times a day (QID) | INTRAVENOUS | Status: DC
  Administered 2020-04-29 – 2020-05-03 (×18): 3 g via INTRAVENOUS
  Filled 2020-04-29 (×3): qty 3
  Filled 2020-04-29: qty 8
  Filled 2020-04-29 (×3): qty 3
  Filled 2020-04-29: qty 8
  Filled 2020-04-29: qty 3
  Filled 2020-04-29: qty 8
  Filled 2020-04-29: qty 3
  Filled 2020-04-29: qty 8
  Filled 2020-04-29: qty 3
  Filled 2020-04-29 (×2): qty 8
  Filled 2020-04-29 (×5): qty 3
  Filled 2020-04-29: qty 8

## 2020-04-29 MED ORDER — PROSOURCE TF PO LIQD
45.0000 mL | Freq: Four times a day (QID) | ORAL | Status: DC
  Administered 2020-04-29 – 2020-05-01 (×8): 45 mL
  Filled 2020-04-29 (×7): qty 45

## 2020-04-29 NOTE — Anesthesia Postprocedure Evaluation (Signed)
Anesthesia Post Note  Patient: Ariana Lawrence  Procedure(s) Performed: OPEN REDUCTION INTERNAL FIXATION (ORIF) BILATERAL MANDIBLE FRACTURES, repair of cervical wound. (Bilateral Face)     Patient location during evaluation: ICU Anesthesia Type: General Level of consciousness: sedated Pain management: pain level controlled Vital Signs Assessment: post-procedure vital signs reviewed and stable Respiratory status: respiratory function stable and patient remains intubated per anesthesia plan Cardiovascular status: stable Postop Assessment: no apparent nausea or vomiting Anesthetic complications: no   No complications documented.  Last Vitals:  Vitals:   04/29/20 0600 04/29/20 0700  BP: 115/70 127/72  Pulse: 73 (!) 107  Resp: 20 20  Temp:    SpO2: 100% 99%    Last Pain:  Vitals:   04/29/20 0400  TempSrc: Axillary                 Ruthie Berch
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
## 2020-04-29 NOTE — Progress Notes (Signed)
Good cuff leak noted when ETT cuff is deflated.   Cuff reinflated to 28 cmH20.
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
## 2020-04-30 LAB — MAGNESIUM
Magnesium: 1.9 mg/dL (ref 1.7–2.4)
Magnesium: 2.1 mg/dL (ref 1.7–2.4)

## 2020-04-30 LAB — NASOPHARYNGEAL CULTURE
Culture: NORMAL
Special Requests: NORMAL

## 2020-04-30 LAB — BASIC METABOLIC PANEL
Anion gap: 8 (ref 5–15)
BUN: 11 mg/dL (ref 6–20)
CO2: 24 mmol/L (ref 22–32)
Calcium: 8.6 mg/dL — ABNORMAL LOW (ref 8.9–10.3)
Chloride: 106 mmol/L (ref 98–111)
Creatinine, Ser: 0.87 mg/dL (ref 0.44–1.00)
GFR, Estimated: >60 mL/min (ref >60)
Glucose, Bld: 150 mg/dL — ABNORMAL HIGH (ref 70–99)
Potassium: 3.7 mmol/L (ref 3.5–5.1)
Sodium: 138 mmol/L (ref 135–145)

## 2020-04-30 LAB — PHOSPHORUS
Phosphorus: 2.1 mg/dL — ABNORMAL LOW (ref 2.5–4.6)
Phosphorus: 1.2 mg/dL — ABNORMAL LOW (ref 2.5–4.6)

## 2020-04-30 LAB — GLUCOSE, CAPILLARY
Glucose-Capillary: 159 mg/dL — ABNORMAL HIGH (ref 70–99)
Glucose-Capillary: 156 mg/dL — ABNORMAL HIGH (ref 70–99)
Glucose-Capillary: 138 mg/dL — ABNORMAL HIGH (ref 70–99)
Glucose-Capillary: 126 mg/dL — ABNORMAL HIGH (ref 70–99)
Glucose-Capillary: 136 mg/dL — ABNORMAL HIGH (ref 70–99)

## 2020-04-30 LAB — CBC
HCT: 30.2 % — ABNORMAL LOW (ref 36.0–46.0)
Hemoglobin: 10.3 g/dL — ABNORMAL LOW (ref 12.0–15.0)
MCH: 29.3 pg (ref 26.0–34.0)
MCHC: 34.1 g/dL (ref 30.0–36.0)
MCV: 85.8 fL (ref 80.0–100.0)
Platelets: 248 10*3/uL (ref 150–400)
RBC: 3.52 MIL/uL — ABNORMAL LOW (ref 3.87–5.11)
RDW: 13.4 % (ref 11.5–15.5)
WBC: 17.9 10*3/uL — ABNORMAL HIGH (ref 4.0–10.5)
nRBC: 0 % (ref 0.0–0.2)

## 2020-04-30 MED ORDER — POLYETHYLENE GLYCOL 3350 17 G PO PACK
17.0000 g | PACK | Freq: Every day | ORAL | Status: DC
  Administered 2020-04-30 – 2020-05-01 (×2): 17 g
  Filled 2020-04-30: qty 1

## 2020-04-30 MED ORDER — PANTOPRAZOLE SODIUM 40 MG PO PACK
40.0000 mg | PACK | Freq: Every day | ORAL | Status: DC
  Administered 2020-05-01: 40 mg
  Filled 2020-04-30: qty 20

## 2020-04-30 MED ORDER — SODIUM CHLORIDE 0.9 % IV SOLN
INTRAVENOUS | Status: DC | PRN
  Administered 2020-04-30: 500 mL via INTRAVENOUS
  Administered 2020-05-01: 250 mL via INTRAVENOUS

## 2020-04-30 MED ORDER — MORPHINE SULFATE (PF) 2 MG/ML IV SOLN
2.0000 mg | INTRAVENOUS | Status: DC | PRN
  Administered 2020-04-30 (×2): 4 mg via INTRAVENOUS
  Administered 2020-05-01 (×4): 2 mg via INTRAVENOUS
  Administered 2020-05-02: 4 mg via INTRAVENOUS
  Administered 2020-05-02 – 2020-05-03 (×2): 2 mg via INTRAVENOUS
  Filled 2020-04-30 (×2): qty 1
  Filled 2020-04-30: qty 2
  Filled 2020-04-30 (×2): qty 1
  Filled 2020-04-30: qty 2
  Filled 2020-04-30: qty 1
  Filled 2020-04-30: qty 2
  Filled 2020-04-30: qty 1

## 2020-04-30 MED ORDER — MORPHINE SULFATE (PF) 2 MG/ML IV SOLN
INTRAVENOUS | Status: AC
  Administered 2020-04-30: 2 mg via INTRAVENOUS
  Filled 2020-04-30: qty 1

## 2020-04-30 MED ORDER — ORAL CARE MOUTH RINSE
15.0000 mL | Freq: Two times a day (BID) | OROMUCOSAL | Status: DC
  Administered 2020-05-01 – 2020-05-03 (×4): 15 mL via OROMUCOSAL

## 2020-04-30 MED ORDER — CHLORHEXIDINE GLUCONATE 0.12 % MT SOLN
15.0000 mL | Freq: Two times a day (BID) | OROMUCOSAL | Status: DC
  Administered 2020-05-01: 15 mL via OROMUCOSAL
  Filled 2020-04-30: qty 15

## 2020-04-30 MED ORDER — DOCUSATE SODIUM 50 MG/5ML PO LIQD
100.0000 mg | Freq: Two times a day (BID) | ORAL | Status: DC
  Administered 2020-04-30 – 2020-05-01 (×2): 100 mg
  Filled 2020-04-30 (×2): qty 10

## 2020-04-30 NOTE — Procedures (Signed)
Extubation Procedure Note  Patient Details:   Name: Ariana Lawrence DOB: 2000/11/26 MRN: [id number]   Airway Documentation:    Vent end date: 04/30/20 Vent end time: 0850   Evaluation  O2 sats: stable throughout Complications: No apparent complications Patient did tolerate procedure well. Bilateral Breath Sounds: Clear   No.  Pt extubated per physician order. Pt suctioned via ETT and orally prior to the best of our abilities. Pt mouth remains wired closed. Pt extubated to 2L nasal cannula, tolerated well. Pt with good cough and no stridor heard at this time. Pt unable to speak at this time due to mouth wired closed. RT will continue to monitor.   Derinda Late 04/30/2020, 8:52 AM
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
## 2020-05-01 LAB — BASIC METABOLIC PANEL
Anion gap: 8 (ref 5–15)
BUN: 16 mg/dL (ref 6–20)
CO2: 25 mmol/L (ref 22–32)
Calcium: 8.5 mg/dL — ABNORMAL LOW (ref 8.9–10.3)
Chloride: 106 mmol/L (ref 98–111)
Creatinine, Ser: 0.79 mg/dL (ref 0.44–1.00)
GFR, Estimated: >60 mL/min (ref >60)
Glucose, Bld: 152 mg/dL — ABNORMAL HIGH (ref 70–99)
Potassium: 3.9 mmol/L (ref 3.5–5.1)
Sodium: 139 mmol/L (ref 135–145)

## 2020-05-01 LAB — CBC
HCT: 32.3 % — ABNORMAL LOW (ref 36.0–46.0)
Hemoglobin: 10.9 g/dL — ABNORMAL LOW (ref 12.0–15.0)
MCH: 29.4 pg (ref 26.0–34.0)
MCHC: 33.7 g/dL (ref 30.0–36.0)
MCV: 87.1 fL (ref 80.0–100.0)
Platelets: 285 10*3/uL (ref 150–400)
RBC: 3.71 MIL/uL — ABNORMAL LOW (ref 3.87–5.11)
RDW: 13.3 % (ref 11.5–15.5)
WBC: 19.4 10*3/uL — ABNORMAL HIGH (ref 4.0–10.5)
nRBC: 0 % (ref 0.0–0.2)

## 2020-05-01 LAB — GLUCOSE, CAPILLARY
Glucose-Capillary: 156 mg/dL — ABNORMAL HIGH (ref 70–99)
Glucose-Capillary: 128 mg/dL — ABNORMAL HIGH (ref 70–99)
Glucose-Capillary: 122 mg/dL — ABNORMAL HIGH (ref 70–99)

## 2020-05-01 LAB — PHOSPHORUS: Phosphorus: 2.6 mg/dL (ref 2.5–4.6)

## 2020-05-01 MED ORDER — POLYETHYLENE GLYCOL 3350 17 G PO PACK
17.0000 g | PACK | Freq: Every day | ORAL | Status: DC
  Administered 2020-05-02: 17 g via ORAL
  Filled 2020-05-01: qty 1

## 2020-05-01 MED ORDER — BOOST / RESOURCE BREEZE PO LIQD CUSTOM
1.0000 | Freq: Three times a day (TID) | ORAL | Status: DC
  Administered 2020-05-01 – 2020-05-02 (×3): 1 via ORAL

## 2020-05-01 MED ORDER — LORAZEPAM 2 MG/ML IJ SOLN
1.0000 mg | Freq: Three times a day (TID) | INTRAMUSCULAR | Status: DC | PRN
  Administered 2020-05-01: 1 mg via INTRAVENOUS
  Filled 2020-05-01: qty 1

## 2020-05-01 MED ORDER — PROSOURCE PLUS PO LIQD
30.0000 mL | Freq: Three times a day (TID) | ORAL | Status: DC
  Administered 2020-05-01 – 2020-05-03 (×5): 30 mL via ORAL
  Filled 2020-05-01 (×7): qty 30

## 2020-05-01 MED ORDER — METHOCARBAMOL 1000 MG/10ML IJ SOLN
1000.0000 mg | Freq: Three times a day (TID) | INTRAVENOUS | Status: DC
  Administered 2020-05-01 – 2020-05-03 (×6): 1000 mg via INTRAVENOUS
  Filled 2020-05-01 (×6): qty 10

## 2020-05-01 MED ORDER — OXYCODONE HCL 5 MG/5ML PO SOLN
5.0000 mg | ORAL | Status: DC | PRN
  Administered 2020-05-01 – 2020-05-02 (×4): 10 mg via ORAL
  Filled 2020-05-01 (×4): qty 10

## 2020-05-01 MED ORDER — POTASSIUM PHOSPHATES 15 MMOLE/5ML IV SOLN
45.0000 mmol | Freq: Once | INTRAVENOUS | Status: AC
  Administered 2020-05-01: 45 mmol via INTRAVENOUS
  Filled 2020-05-01 (×2): qty 15

## 2020-05-01 MED ORDER — VITAL 1.5 CAL PO LIQD
1000.0000 mL | ORAL | Status: DC
  Administered 2020-05-01: 1000 mL
  Filled 2020-05-01 (×2): qty 1000

## 2020-05-01 MED ORDER — ACETAMINOPHEN 160 MG/5ML PO SOLN
650.0000 mg | Freq: Four times a day (QID) | ORAL | Status: DC
  Administered 2020-05-01 – 2020-05-03 (×9): 650 mg via ORAL
  Filled 2020-05-01 (×9): qty 20.3

## 2020-05-01 MED ORDER — CHLORHEXIDINE GLUCONATE 0.12 % MT SOLN
15.0000 mL | Freq: Three times a day (TID) | OROMUCOSAL | Status: DC
  Administered 2020-05-01 – 2020-05-03 (×4): 15 mL via OROMUCOSAL
  Filled 2020-05-01 (×5): qty 15

## 2020-05-01 MED ORDER — DOCUSATE SODIUM 50 MG/5ML PO LIQD
100.0000 mg | Freq: Two times a day (BID) | ORAL | Status: DC
  Administered 2020-05-01 – 2020-05-02 (×2): 100 mg via ORAL
  Filled 2020-05-01 (×2): qty 10

## 2020-05-01 MED ORDER — PANTOPRAZOLE SODIUM 40 MG PO PACK
40.0000 mg | PACK | Freq: Every day | ORAL | Status: DC
  Administered 2020-05-02 – 2020-05-03 (×2): 40 mg via ORAL
  Filled 2020-05-01 (×2): qty 20
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
## 2020-05-01 NOTE — Progress Notes (Signed)
Pt started on clear liquid diet. Lunch tray came. Pt was able to feed self jello. Pt used straw for broth and icee. Pt drank all of the boost breeze. Pt tolerated well. Mayford Knife RN

## 2020-05-01 NOTE — Progress Notes (Signed)
Patient transferred to room 4NP05 with all belongings. Bedside report given to [facility], RN and mother is aware per patient.
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
## 2020-05-02 LAB — CBC
HCT: 31.6 % — ABNORMAL LOW (ref 36.0–46.0)
Hemoglobin: 10.6 g/dL — ABNORMAL LOW (ref 12.0–15.0)
MCH: 29.4 pg (ref 26.0–34.0)
MCHC: 33.5 g/dL (ref 30.0–36.0)
MCV: 87.8 fL (ref 80.0–100.0)
Platelets: 306 10*3/uL (ref 150–400)
RBC: 3.6 MIL/uL — ABNORMAL LOW (ref 3.87–5.11)
RDW: 13.1 % (ref 11.5–15.5)
WBC: 16.9 10*3/uL — ABNORMAL HIGH (ref 4.0–10.5)
nRBC: 0 % (ref 0.0–0.2)

## 2020-05-02 LAB — GLUCOSE, CAPILLARY
Glucose-Capillary: 117 mg/dL — ABNORMAL HIGH (ref 70–99)
Glucose-Capillary: 83 mg/dL (ref 70–99)
Glucose-Capillary: 83 mg/dL (ref 70–99)
Glucose-Capillary: 87 mg/dL (ref 70–99)
Glucose-Capillary: 92 mg/dL (ref 70–99)
Glucose-Capillary: 96 mg/dL (ref 70–99)

## 2020-05-02 LAB — BASIC METABOLIC PANEL
Anion gap: 8 (ref 5–15)
BUN: 17 mg/dL (ref 6–20)
CO2: 25 mmol/L (ref 22–32)
Calcium: 8.1 mg/dL — ABNORMAL LOW (ref 8.9–10.3)
Chloride: 106 mmol/L (ref 98–111)
Creatinine, Ser: 0.8 mg/dL (ref 0.44–1.00)
GFR, Estimated: >60 mL/min (ref >60)
Glucose, Bld: 100 mg/dL — ABNORMAL HIGH (ref 70–99)
Potassium: 3.5 mmol/L (ref 3.5–5.1)
Sodium: 139 mmol/L (ref 135–145)

## 2020-05-02 MED ORDER — ENSURE ENLIVE PO LIQD
237.0000 mL | Freq: Two times a day (BID) | ORAL | Status: DC
  Administered 2020-05-02: 237 mL via ORAL

## 2020-05-02 MED ORDER — POLYETHYLENE GLYCOL 3350 17 G PO PACK
17.0000 g | PACK | Freq: Two times a day (BID) | ORAL | Status: DC
  Administered 2020-05-03: 17 g via ORAL
  Filled 2020-05-02 (×2): qty 1

## 2020-05-02 MED ORDER — BACITRACIN ZINC 500 UNIT/GM EX OINT
TOPICAL_OINTMENT | Freq: Two times a day (BID) | CUTANEOUS | Status: DC
  Filled 2020-05-02: qty 28.4

## 2020-05-02 MED ORDER — ENSURE ENLIVE PO LIQD
237.0000 mL | Freq: Three times a day (TID) | ORAL | Status: DC
  Administered 2020-05-02 – 2020-05-03 (×4): 237 mL via ORAL
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
## 2020-05-02 NOTE — TOC CAGE-AID Note (Signed)
Transition of Care (TOC) - CAGE-AID Screening   Patient Details  Name: Ariana Lawrence MRN: [id number] Date of Birth: 07/31/2000  Transition of Care (TOC) CM/SW Contact:    Miranda  Gainey, LCSWA Phone Number: 05/02/2020, 1:37 PM   Clinical Narrative:  CSW met with pt at bedside. CSW introduced self and explained role at the hospital.  Pt denies alcohol use. Pt denies substance use. Pt did not need any resources at this time.    CAGE-AID Screening:    Have You Ever Felt You Ought to Cut Down on Your Drinking or Drug Use?: No Have People Annoyed You By Critizing Your Drinking Or Drug Use?: No Have You Felt Bad Or Guilty About Your Drinking Or Drug Use?: No Have You Ever Had a Drink or Used Drugs First Thing In The Morning to Steady Your Nerves or to Get Rid of a Hangover?: No CAGE-AID Score: 0  Substance Abuse Education Offered: Yes    Miranda Gainey, LCSWA, LCASA Clinical Social Worker [phone]       

## 2020-05-03 LAB — CBC
HCT: 36.9 % (ref 36.0–46.0)
Hemoglobin: 12.5 g/dL (ref 12.0–15.0)
MCH: 29.8 pg (ref 26.0–34.0)
MCHC: 33.9 g/dL (ref 30.0–36.0)
MCV: 87.9 fL (ref 80.0–100.0)
Platelets: 370 10*3/uL (ref 150–400)
RBC: 4.2 MIL/uL (ref 3.87–5.11)
RDW: 13 % (ref 11.5–15.5)
WBC: 16.6 10*3/uL — ABNORMAL HIGH (ref 4.0–10.5)
nRBC: 0.1 % (ref 0.0–0.2)

## 2020-05-03 LAB — BASIC METABOLIC PANEL
Anion gap: 12 (ref 5–15)
BUN: 11 mg/dL (ref 6–20)
CO2: 23 mmol/L (ref 22–32)
Calcium: 8.7 mg/dL — ABNORMAL LOW (ref 8.9–10.3)
Chloride: 101 mmol/L (ref 98–111)
Creatinine, Ser: 1.21 mg/dL — ABNORMAL HIGH (ref 0.44–1.00)
GFR, Estimated: >60 mL/min (ref >60)
Glucose, Bld: 75 mg/dL (ref 70–99)
Potassium: 3.5 mmol/L (ref 3.5–5.1)
Sodium: 136 mmol/L (ref 135–145)

## 2020-05-03 LAB — GLUCOSE, CAPILLARY
Glucose-Capillary: 102 mg/dL — ABNORMAL HIGH (ref 70–99)
Glucose-Capillary: 75 mg/dL (ref 70–99)
Glucose-Capillary: 86 mg/dL (ref 70–99)

## 2020-05-03 MED ORDER — OXYCODONE HCL 5 MG/5ML PO SOLN
5.0000 mg | Freq: Four times a day (QID) | ORAL | 0 refills | Status: DC | PRN
Start: 2020-05-03 — End: 2020-05-03

## 2020-05-03 MED ORDER — AMOXICILLIN-POT CLAVULANATE 250-62.5 MG/5ML PO SUSR: 250.0000 mg | Freq: Two times a day (BID) | ORAL | 0 refills | Status: AC

## 2020-05-03 MED ORDER — CHLORHEXIDINE GLUCONATE 0.12 % MT SOLN: 15.0000 mL | Freq: Three times a day (TID) | OROMUCOSAL | 0 refills | Status: DC

## 2020-05-03 MED ORDER — ONDANSETRON HCL 4 MG/5ML PO SOLN: 4.0000 mg | Freq: Four times a day (QID) | ORAL | 0 refills | Status: DC | PRN

## 2020-05-03 MED ORDER — LACTATED RINGERS IV BOLUS
1000.0000 mL | Freq: Once | INTRAVENOUS | Status: AC
  Administered 2020-05-03: 1000 mL via INTRAVENOUS

## 2020-05-03 MED ORDER — OXYCODONE HCL 5 MG/5ML PO SOLN
5.0000 mg | Freq: Four times a day (QID) | ORAL | 0 refills | Status: AC | PRN
Start: 2020-05-03 — End: 2020-05-08

## 2020-05-03 MED ORDER — POLYETHYLENE GLYCOL 3350 17 G PO PACK
17.0000 g | PACK | Freq: Two times a day (BID) | ORAL | 0 refills | Status: DC
Start: 2020-05-03 — End: 2020-12-18

## 2020-05-03 MED ORDER — ENSURE ENLIVE PO LIQD: 237.0000 mL | Freq: Three times a day (TID) | ORAL | 12 refills | Status: DC

## 2020-05-03 MED ORDER — CHLORHEXIDINE GLUCONATE 0.12 % MT SOLN: 15.0000 mL | Freq: Three times a day (TID) | OROMUCOSAL | 0 refills | Status: AC

## 2020-05-03 MED ORDER — AMOXICILLIN-POT CLAVULANATE 250-62.5 MG/5ML PO SUSR: 250.0000 mg | Freq: Two times a day (BID) | ORAL | 0 refills | Status: DC

## 2020-05-03 MED ORDER — ACETAMINOPHEN 160 MG/5ML PO SOLN
650.0000 mg | Freq: Four times a day (QID) | ORAL | 0 refills | Status: DC | PRN
Start: 2020-05-03 — End: 2020-11-25

## 2020-05-03 MED ORDER — BACITRACIN ZINC 500 UNIT/GM EX OINT
TOPICAL_OINTMENT | Freq: Two times a day (BID) | CUTANEOUS | 0 refills | Status: DC
Start: 2020-05-03 — End: 2020-11-25
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
## 2020-05-05 ENCOUNTER — Emergency Department (HOSPITAL_COMMUNITY)
Admission: EM | Admit: 2020-05-05 | Discharge: 2020-05-06 | Disposition: A | Discharge status: Home / Self Care | Payer: Medicaid Other | Attending: Emergency Medicine | Admitting: Emergency Medicine

## 2020-05-05 ENCOUNTER — Other Ambulatory Visit: Payer: Self-pay

## 2020-05-05 DIAGNOSIS — J189 Pneumonia, unspecified organism: Secondary | ICD-10-CM

## 2020-05-05 DIAGNOSIS — R079 Chest pain, unspecified: Secondary | ICD-10-CM | POA: Insufficient documentation

## 2020-05-05 DIAGNOSIS — M549 Dorsalgia, unspecified: Secondary | ICD-10-CM | POA: Insufficient documentation

## 2020-05-05 DIAGNOSIS — R0781 Pleurodynia: Secondary | ICD-10-CM | POA: Insufficient documentation

## 2020-05-06 ENCOUNTER — Emergency Department (HOSPITAL_COMMUNITY): Payer: Medicaid Other

## 2020-05-06 ENCOUNTER — Encounter (HOSPITAL_COMMUNITY): Payer: Self-pay | Admitting: Emergency Medicine

## 2020-05-06 ENCOUNTER — Telehealth: Payer: Self-pay | Admitting: Surgery

## 2020-05-06 ENCOUNTER — Other Ambulatory Visit: Payer: Self-pay

## 2020-05-06 LAB — PROTIME-INR
INR: 1.1 (ref 0.8–1.2)
Prothrombin Time: 13.3 seconds (ref 11.4–15.2)

## 2020-05-06 LAB — BASIC METABOLIC PANEL
Anion gap: 11 (ref 5–15)
BUN: 12 mg/dL (ref 6–20)
CO2: 24 mmol/L (ref 22–32)
Calcium: 9.1 mg/dL (ref 8.9–10.3)
Chloride: 101 mmol/L (ref 98–111)
Creatinine, Ser: 0.93 mg/dL (ref 0.44–1.00)
GFR, Estimated: >60 mL/min (ref >60)
Glucose, Bld: 108 mg/dL — ABNORMAL HIGH (ref 70–99)
Potassium: 3.8 mmol/L (ref 3.5–5.1)
Sodium: 136 mmol/L (ref 135–145)

## 2020-05-06 LAB — CBC
HCT: 35.8 % — ABNORMAL LOW (ref 36.0–46.0)
Hemoglobin: 11.9 g/dL — ABNORMAL LOW (ref 12.0–15.0)
MCH: 29.1 pg (ref 26.0–34.0)
MCHC: 33.2 g/dL (ref 30.0–36.0)
MCV: 87.5 fL (ref 80.0–100.0)
Platelets: 503 10*3/uL — ABNORMAL HIGH (ref 150–400)
RBC: 4.09 MIL/uL (ref 3.87–5.11)
RDW: 12.8 % (ref 11.5–15.5)
WBC: 25.1 10*3/uL — ABNORMAL HIGH (ref 4.0–10.5)
nRBC: 0 % (ref 0.0–0.2)

## 2020-05-06 LAB — TROPONIN I (HIGH SENSITIVITY)
Troponin I (High Sensitivity): 5 ng/L (ref <18)
Troponin I (High Sensitivity): 7 ng/L (ref <18)

## 2020-05-06 LAB — LACTIC ACID, PLASMA: Lactic Acid, Venous: 1.4 mmol/L (ref 0.5–1.9)

## 2020-05-06 LAB — I-STAT BETA HCG BLOOD, ED (MC, WL, AP ONLY): I-stat hCG, quantitative: 11.3 m[IU]/mL — ABNORMAL HIGH (ref <5)

## 2020-05-06 LAB — POC URINE PREG, ED: Preg Test, Ur: NEGATIVE

## 2020-05-06 MED ORDER — IOHEXOL 350 MG/ML SOLN
60.0000 mL | Freq: Once | INTRAVENOUS | Status: AC | PRN
  Administered 2020-05-06: 60 mL via INTRAVENOUS

## 2020-05-06 MED ORDER — PIPERACILLIN-TAZOBACTAM 3.375 G IVPB 30 MIN
3.3750 g | Freq: Once | INTRAVENOUS | Status: AC
  Administered 2020-05-06: 3.375 g via INTRAVENOUS
  Filled 2020-05-06: qty 50

## 2020-05-06 MED ORDER — PIPERACILLIN-TAZOBACTAM 3.375 G IVPB: 3.3750 g | Freq: Three times a day (TID) | INTRAVENOUS | Status: DC

## 2020-05-06 MED ORDER — KETOROLAC TROMETHAMINE 15 MG/ML IJ SOLN
15.0000 mg | Freq: Once | INTRAMUSCULAR | Status: AC
  Administered 2020-05-06: 15 mg via INTRAVENOUS
  Filled 2020-05-06: qty 1

## 2020-05-06 MED ORDER — AMOXICILLIN-POT CLAVULANATE 250-62.5 MG/5ML PO SUSR: 250.0000 mg | Freq: Two times a day (BID) | ORAL | 0 refills | Status: AC

## 2020-05-06 MED ORDER — MORPHINE SULFATE (PF) 2 MG/ML IV SOLN
2.0000 mg | Freq: Once | INTRAVENOUS | Status: AC
  Administered 2020-05-06: 2 mg via INTRAMUSCULAR
  Filled 2020-05-06: qty 1

## 2020-05-06 MED ORDER — MORPHINE SULFATE (PF) 2 MG/ML IV SOLN: 2.0000 mg | Freq: Once | INTRAVENOUS | Status: DC

## 2020-05-06 NOTE — ED Notes (Signed)
First contact. Change of shift. Pt resting in bed. NADN 
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
## 2020-05-06 NOTE — ED Notes (Signed)
Pt to CT

## 2020-05-06 NOTE — Telephone Encounter (Signed)
ED CM received call from patient stating she was seen in the ED last night, but did not receive a prescription for antibiotics. ED CM noted a prescription for Augmentin which was sent to [facility] on [address]. Updated patient on where the prescription is available for pick up.  Patient verbalized understanding. No further ED CM needs identified

## 2020-05-06 NOTE — ED Notes (Signed)
Blood cx collected. Abx initiated. Pt resting in bed. NADN
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
## 2020-05-06 NOTE — ED Notes (Signed)
Pt returned from CT. NADN

## 2020-05-06 NOTE — Progress Notes (Signed)
Pharmacy Antibiotic Note  CAIDEN MONSIVAIS is a 19 y.o. female admitted on 05/05/2020 with pneumonia.  Pharmacy has been consulted for Zosyn dosing. SCr 0.93 on admit.  Plan: Zosyn 3.375g IV ( infusion) x1; then 3.375g IV q8h (4h infusion) Monitor clinical progress, c/s, renal function F/u de-escalation plan/LOT    Temp (24hrs), Avg:98.2 F (36.8 C), Min:97.9 F (36.6 C), Max:98.4 F (36.9 C)  Recent Labs  Lab 04/30/20 0333 05/01/20 0100 05/02/20 0321 05/03/20 0459 05/06/20 0018  WBC 17.9* 19.4* 16.9* 16.6* 25.1*  CREATININE 0.87 0.79 0.80 1.21* 0.93    Estimated Creatinine Clearance: 146.4 mL/min (by C-G formula based on SCr of 0.93 mg/dL).    No Known Allergies   Leia Alf, PharmD, BCPS Please check AMION for all [facility] contact numbers Clinical Pharmacist 05/06/2020 10:24 AM

## 2020-05-06 NOTE — ED Notes (Signed)
Pt verbalized understanding of discharge paperwork and prescription.  °

## 2020-05-06 NOTE — ED Notes (Signed)
Blood culture collection delayed d/t poor peripheral vasculature. Phlebotomy at bedside to assess. Joldersma PA notified.

## 2020-05-06 NOTE — Discharge Instructions (Addendum)
Like we discussed, I am prescribing you another week of your antibiotic.  This is called Augmentin.  When you finish your first week be sure to fill your next prescription and take this for another 7 days.  Please make sure that you go to your follow-up appointment in 1 week.  If you develop any new or worsening symptoms you need to return to the ER so we can reevaluate you.  It was a pleasure to meet you and good luck.

## 2020-05-06 NOTE — ED Triage Notes (Signed)
Patient reports central chest pain radiating to mid back onset this afternoon with SOB , no emesis or diaphoresis , jaw surgery last 04/28/20. No fever or cough.

## 2020-05-06 NOTE — ED Notes (Signed)
Assisted pt with dressing changes. Applied pt to cardiac monitor.

## 2020-05-11 LAB — CULTURE, BLOOD (ROUTINE X 2)
Culture: NO GROWTH
Culture: NO GROWTH
Special Requests: ADEQUATE

## 2020-05-12 ENCOUNTER — Encounter (HOSPITAL_COMMUNITY): Payer: Self-pay

## 2020-05-14 ENCOUNTER — Other Ambulatory Visit: Payer: Self-pay

## 2020-05-14 ENCOUNTER — Ambulatory Visit: Payer: Medicaid Other | Attending: General Surgery | Admitting: Physical Therapy

## 2020-05-14 DIAGNOSIS — M25512 Pain in left shoulder: Secondary | ICD-10-CM | POA: Diagnosis present

## 2020-05-14 DIAGNOSIS — R293 Abnormal posture: Secondary | ICD-10-CM

## 2020-05-14 DIAGNOSIS — M6281 Muscle weakness (generalized): Secondary | ICD-10-CM | POA: Insufficient documentation

## 2020-05-14 DIAGNOSIS — M25612 Stiffness of left shoulder, not elsewhere classified: Secondary | ICD-10-CM
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
## 2020-05-14 NOTE — Patient Instructions (Signed)
    Home exercise program created by Maxden Naji, PT.  For questions, please contact Sabina Beavers via phone at [phone] or email at [email or web address]  [facility] [place] [address]  [address] [place], [place], [address] Phone: [phone]   Fax:  [phone]    

## 2020-05-19 ENCOUNTER — Encounter: Payer: Self-pay | Admitting: Physical Therapy

## 2020-05-19 ENCOUNTER — Ambulatory Visit: Payer: Medicaid Other | Admitting: Physical Therapy

## 2020-05-19 ENCOUNTER — Other Ambulatory Visit: Payer: Self-pay

## 2020-05-19 DIAGNOSIS — M25612 Stiffness of left shoulder, not elsewhere classified: Secondary | ICD-10-CM

## 2020-05-19 DIAGNOSIS — M25512 Pain in left shoulder: Secondary | ICD-10-CM

## 2020-05-19 DIAGNOSIS — R293 Abnormal posture: Secondary | ICD-10-CM

## 2020-05-19 DIAGNOSIS — M6281 Muscle weakness (generalized): Secondary | ICD-10-CM
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
## 2020-05-19 NOTE — Patient Instructions (Signed)
    Home exercise program created by Rosalyn Archambault, PT.  For questions, please contact Anslie Spadafora via phone at [phone] or email at [email or web address]  [facility] [place] [address]  [address] [place], [place], [address] Phone: [phone]   Fax:  [phone]    

## 2020-05-22 ENCOUNTER — Ambulatory Visit: Payer: Medicaid Other

## 2020-05-26 ENCOUNTER — Ambulatory Visit: Payer: Medicaid Other | Admitting: Physical Therapy

## 2020-05-29 ENCOUNTER — Ambulatory Visit: Payer: Medicaid Other

## 2020-06-02 ENCOUNTER — Ambulatory Visit: Payer: Medicaid Other | Admitting: Physical Therapy

## 2020-06-04 ENCOUNTER — Encounter: Payer: Medicaid Other | Admitting: Physical Therapy

## 2020-06-12 ENCOUNTER — Encounter: Payer: Medicaid Other | Admitting: Physical Therapy
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
## 2020-06-19 ENCOUNTER — Encounter: Payer: Medicaid Other | Admitting: Physical Therapy

## 2020-06-26 ENCOUNTER — Encounter: Payer: Medicaid Other | Admitting: Physical Therapy

## 2020-11-13 ENCOUNTER — Inpatient Hospital Stay (HOSPITAL_COMMUNITY)
Admission: EM | Admit: 2020-11-13 | Discharge: 2020-11-13 | Disposition: A | Discharge status: Home / Self Care | Payer: Medicaid Other | Attending: Obstetrics & Gynecology | Admitting: Obstetrics & Gynecology

## 2020-11-13 ENCOUNTER — Inpatient Hospital Stay (HOSPITAL_COMMUNITY): Payer: Medicaid Other

## 2020-11-13 ENCOUNTER — Encounter (HOSPITAL_COMMUNITY): Payer: Self-pay | Admitting: Obstetrics & Gynecology

## 2020-11-13 ENCOUNTER — Other Ambulatory Visit: Payer: Self-pay

## 2020-11-13 DIAGNOSIS — O26891 Other specified pregnancy related conditions, first trimester: Secondary | ICD-10-CM | POA: Insufficient documentation

## 2020-11-13 DIAGNOSIS — R102 Pelvic and perineal pain: Secondary | ICD-10-CM | POA: Diagnosis not present

## 2020-11-13 DIAGNOSIS — Z6790 Unspecified blood type, Rh positive: Secondary | ICD-10-CM

## 2020-11-13 DIAGNOSIS — O3680X0 Pregnancy with inconclusive fetal viability, not applicable or unspecified: Secondary | ICD-10-CM | POA: Diagnosis not present

## 2020-11-13 DIAGNOSIS — O26899 Other specified pregnancy related conditions, unspecified trimester: Secondary | ICD-10-CM

## 2020-11-13 DIAGNOSIS — Z3A01 Less than 8 weeks gestation of pregnancy: Secondary | ICD-10-CM | POA: Insufficient documentation

## 2020-11-13 DIAGNOSIS — R109 Unspecified abdominal pain: Secondary | ICD-10-CM | POA: Diagnosis present

## 2020-11-13 HISTORY — DX: Anxiety disorder, unspecified: F41.9

## 2020-11-13 HISTORY — DX: Post-traumatic stress disorder, unspecified: F43.10

## 2020-11-13 HISTORY — DX: Accidental discharge from unspecified firearms or gun, initial encounter: W34.00XA

## 2020-11-13 HISTORY — DX: Unspecified firearm discharge, undetermined intent, initial encounter: Y24.9XXA

## 2020-11-13 LAB — COMPREHENSIVE METABOLIC PANEL
ALT: 14 U/L (ref 0–44)
AST: 21 U/L (ref 15–41)
Albumin: 3.7 g/dL (ref 3.5–5.0)
Alkaline Phosphatase: 59 U/L (ref 38–126)
Anion gap: 7 (ref 5–15)
BUN: 7 mg/dL (ref 6–20)
CO2: 24 mmol/L (ref 22–32)
Calcium: 9 mg/dL (ref 8.9–10.3)
Chloride: 107 mmol/L (ref 98–111)
Creatinine, Ser: 0.77 mg/dL (ref 0.44–1.00)
GFR, Estimated: >60 mL/min (ref >60)
Glucose, Bld: 88 mg/dL (ref 70–99)
Potassium: 4.1 mmol/L (ref 3.5–5.1)
Sodium: 138 mmol/L (ref 135–145)
Total Bilirubin: 0.7 mg/dL (ref 0.3–1.2)
Total Protein: 6.9 g/dL (ref 6.5–8.1)

## 2020-11-13 LAB — CBC
HCT: 38 % (ref 36.0–46.0)
Hemoglobin: 12.8 g/dL (ref 12.0–15.0)
MCH: 29.1 pg (ref 26.0–34.0)
MCHC: 33.7 g/dL (ref 30.0–36.0)
MCV: 86.4 fL (ref 80.0–100.0)
Platelets: 321 10*3/uL (ref 150–400)
RBC: 4.4 MIL/uL (ref 3.87–5.11)
RDW: 12.8 % (ref 11.5–15.5)
WBC: 8.5 10*3/uL (ref 4.0–10.5)
nRBC: 0 % (ref 0.0–0.2)

## 2020-11-13 LAB — URINALYSIS, ROUTINE W REFLEX MICROSCOPIC
Bilirubin Urine: NEGATIVE
Glucose, UA: NEGATIVE mg/dL
Hgb urine dipstick: NEGATIVE
Ketones, ur: NEGATIVE mg/dL
Leukocytes,Ua: NEGATIVE
Nitrite: NEGATIVE
Protein, ur: NEGATIVE mg/dL
Specific Gravity, Urine: 1.026 (ref 1.005–1.030)
pH: 7 (ref 5.0–8.0)

## 2020-11-13 LAB — HCG, QUANTITATIVE, PREGNANCY: hCG, Beta Chain, Quant, S: 1814 m[IU]/mL — ABNORMAL HIGH (ref <5)

## 2020-11-13 LAB — POC URINE PREG, ED: Preg Test, Ur: POSITIVE — AB

## 2020-11-13 NOTE — ED Provider Notes (Signed)
Emergency Medicine Provider OB Triage Evaluation Note  Ariana Lawrence is a 20 y.o. female, G2P2000, at Unknown gestation who presents to the emergency department with complaints of lower abdominal cramping.  Was seen yesterday by urgent care for similar complaint as well as congestion and rhinorrhea.  States she had negative COVID test however her pregnancy test was positive.  She denies any vaginal bleeding or fluid leakage.  Lower abdominal cramping has been intermittent over the last week.  Worsened today which caused her to come to the emergency department she does not have OB/GYN or PCP follow-up.  Denies any urinary complaints.  Patient is unsure of last LMP.  Review of  Systems  Positive: Abdominal cramping Negative: Lightheadedness, dizziness, vaginal bleeding, fluid leakage  Physical Exam  BP 124/75 (BP Location: Right Arm)   Pulse 79   Temp 98.7 F (37.1 C) (Oral)   Resp 16   SpO2 100%  General: Awake, no distress  HEENT: Atraumatic  Resp: Normal effort  Cardiac: Normal rate Abd: Nondistended, nontender  MSK: Moves all extremities without difficulty Neuro: Speech clear  Medical Decision Making  Pt evaluated for pregnancy concern and is stable for transfer to MAU. Pt is in agreement with plan for transfer.  9:37 AM Discussed with ObGyn at MAU who accepts patient in transfer.  Clinical Impression  Abd pain in pregnancy     Floree Zuniga A, PA-C 11/13/20 0945    Benjiman Core, MD 11/13/20 1651
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
## 2020-11-13 NOTE — MAU Note (Signed)
Presents with c/o abdominal cramping that began 1 week ago.  Denies VB.  +UPT @ Urgent Care yesterday and  today.  LMP 10/07/2020

## 2020-11-13 NOTE — Discharge Instructions (Signed)
Ectopic Pregnancy  An ectopic pregnancy happens when a fertilized egg attaches (implants) outside the uterus. In a normal pregnancy, a fertilized egg implants in the uterus. An ectopic pregnancy cannot develop into a healthy baby. Most ectopic pregnancies occur in one of the fallopian tubes, which is where an egg travels from an ovary to get to the uterus. This is called a tubal pregnancy. An ectopic pregnancy can also happen on an ovary, on the cervix, or in the abdomen. When a fertilized egg implants on tissue outside the uterus and begins to grow, it may cause the tissue to tear or burst. This is known as a ruptured ectopic pregnancy. The tear or burst causes internal bleeding. This may cause intense pain in the abdomen. An ectopic pregnancy is a medical emergency and can be life-threatening. What are the causes? The most common cause of this condition is damage to one of the fallopian tubes. A fallopian tube may be narrowed or blocked, and that stops the fertilized egg from reaching the uterus. Sometimes, the cause of this condition is not known. What increases the risk? The following factors may make you more likely to develop this condition:  Having gone through infertility treatment before.  Having had an ectopic pregnancy before.  Having had surgery to have the fallopian tubes tied.  Becoming pregnant while using an intrauterine device for birth control.  Taking birth control pills before the age of 16. Other risk factors include:  Smoking.  Alcohol use.  History of DES exposure. DES is a medicine that was used until 1971 and affected babies whose mothers took the medicine. What are the signs or symptoms? Common symptoms of this condition include:  Missing a menstrual period.  Nausea or tiredness.  Tender breasts.  Other normal pregnancy symptoms. Other symptoms may include:  Pain during sex.  Vaginal bleeding or spotting.  Cramping or pain in the lower abdomen.  A  fast heartbeat, low blood pressure, and sweating.  Pain or increased pressure while having a bowel movement. Symptoms of a ruptured ectopic pregnancy and internal bleeding may include:  Sudden, severe pain in the abdomen.  Dizziness, weakness, feeling light-headed, or fainting.  Pain in the shoulder or neck area. How is this diagnosed? This condition is diagnosed by:  A blood test to check for the pregnancy hormone.  A pelvic exam to find painful areas or a mass in the abdomen.  Ultrasound. A probe is inserted into the vagina to see if there is a pregnancy in or outside the uterus.  Taking a sample of tissue from the uterus.  Surgery to look closely at the fallopian tubes through an incision in the abdomen. How is this treated? This condition is usually treated with medicine or surgery. Sometimes, ectopic pregnancies can resolve on their own, under close monitoring by your health care provider. Medicine A medicine called methotrexate may be given to cause the pregnancy tissue to be absorbed. The medicine may be given if:  The diagnosis is made early, with no signs of active bleeding.  The fallopian tube has not torn or burst. You will need blood tests to make sure the medicine is working. It may take 4-6 weeks for the pregnancy tissues to be absorbed. Surgery Surgery may be performed to:  Remove the pregnancy tissue.  Stop internal bleeding.  Remove part or all of the fallopian tube.  Remove the uterus. This is rare. After surgery, you may need to have blood tests to make sure the surgery worked.   Follow these instructions at home: Medicines  Take over-the-counter and prescription medicines only as told by your health care provider.  Ask your health care provider if the medicine prescribed to you: ? Requires you to avoid driving or using machinery. ? Can cause constipation. You may need to take these actions to prevent or treat constipation:  Drink enough fluid to  keep your urine pale yellow.  Take over-the-counter or prescription medicines.  Eat foods that are high in fiber, such as beans, whole grains, and fresh fruits and vegetables.  Limit foods that are high in fat and processed sugars, such as fried or sweet foods. General instructions  Rest or limit your activity, if told by your health care provider.  Do not have sex or put anything in your vagina, such as tampons or douches, for 6 weeks or until your health care provider says it is safe.  Do not lift anything that is heavier than 10 lb (4.5 kg), or the limit that you are told, until your health care provider says that it is safe.  Return to your normal activities as told by your health care provider. Ask your health care provider what activities are safe for you.  Keep all follow-up visits. This is important. Contact a health care provider if:  You have a fever or chills.  You have nausea and vomiting. Get help right away if:  Your pain gets worse or is not relieved by medicine.  You feel dizzy or weak.  You feel light-headed or you faint.  You have sudden, severe pain in your abdomen.  You have sudden pain in the shoulder or neck area. Summary  An ectopic pregnancy happens when a fertilized egg implants outside the uterus. Most ectopic pregnancies occur in one of the fallopian tubes.  An ectopic pregnancy is a medical emergency and can be life-threatening.  The most common cause of this condition is damage to one of the fallopian tubes.  This condition is usually treated with medicine or surgery. Some ectopic pregnancies resolve on their own, under close monitoring by your health care provider. This information is not intended to replace advice given to you by your health care provider. Make sure you discuss any questions you have with your health care provider. Document Revised: 10/09/2019 Document Reviewed: 10/09/2019 Elsevier Patient Education  2021 Elsevier  Inc.        Miscarriage A miscarriage is the loss of pregnancy before the 20th week. Most miscarriages happen during the first 3 months of pregnancy. Sometimes, a miscarriage can happen before a woman knows that she is pregnant. Having a miscarriage can be an emotional experience. If you have had a miscarriage, talk with your health care provider about any questions you may have about the loss of your baby, the grieving process, and your plans for future pregnancy. What are the causes? Many times, the cause of a miscarriage is not known. What increases the risk? The following factors may make a pregnant woman more likely to have a miscarriage: Certain medical conditions  Conditions that affect the hormone balance in the body, such as thyroid disease or polycystic ovary syndrome.  Diabetes.  Autoimmune disorders.  Infections.  Bleeding disorders.  Obesity. Lifestyle factors  Using products with tobacco or nicotine in them or being exposed to tobacco smoke.  Having alcohol.  Having large amounts of caffeine.  Recreational drug use. Problems with reproductive organs or structures  Cervical insufficiency. This is when the lowest part of the uterus (cervix)  opens and thins before pregnancy is at term.  Having a condition called Asherman syndrome. This syndrome causes scarring in the uterus or causes the uterus to be abnormal in structure.  Fibrous growths, called fibroids, in the uterus.  Congenital abnormalities. These problems are present at birth.  Infection of the cervix or uterus. Personal or medical history  Injury (trauma).  Having had a miscarriage before.  Being younger than age 50 or older than age 73.  Exposure to harmful substances in the environment. This may include radiation or heavy metals, such as lead.  Use of certain medicines. What are the signs or symptoms? Symptoms of this condition include:  Vaginal bleeding or spotting, with or without  cramps or pain.  Pain or cramping in the abdomen or lower back.  Fluid or tissue coming out of the vagina. How is this diagnosed? This condition may be diagnosed based on:  A physical exam.  Ultrasound.  Lab tests, such as blood tests, urine tests, or swabs for infection. How is this treated? Treatment for a miscarriage is sometimes not needed if all the pregnancy tissue that was in the uterus comes out on its own, and there are no other problems such as infection or heavy bleeding. In other cases, this condition may be treated with:  Dilation and curettage (D&C). In this procedure, the cervix is stretched open and any remaining pregnancy tissue is removed from the lining of the uterus (endometrium).  Medicines. These may include: ? Antibiotic medicine, to treat infection. ? Medicine to help any remaining pregnancy tissue come out of the body. ? Medicine to reduce (contract) the size of the uterus. These medicines may be given if there is a lot of bleeding. If you have Rh-negative blood, you may be given an injection of a medicine called Rho(D) immune globulin. This medicine helps prevent problems with future pregnancies. Follow these instructions at home: Medicines  Take over-the-counter and prescription medicines only as told by your health care provider.  If you were prescribed antibiotic medicine, take it as told by your health care provider. Do not stop taking the antibiotic even if you start to feel better. Activity  Rest as told by your health care provider. Ask your health care provider what activities are safe for you.  Have someone help with home and family responsibilities during this time. General instructions  Monitor how much tissue or blood clot material comes out of the vagina.  Do not have sex, douche, or put anything, such as tampons, in your vagina until your health care provider says it is okay.  To help you and your partner with the grieving process, talk  with your health care provider or get counseling.  When you are ready, meet with your health care provider to discuss any important steps you should take for your health. Also, discuss steps you should take to have a healthy pregnancy in the future.  Keep all follow-up visits. This is important.   Where to find more information  The [employer]ians [employer]necologists: acog.org  [place] Department of Health and Cytogeneticist of Women's Health: http://hoffman.com/ Contact a health care provider if:  You have a fever or chills.  There is bad-smelling fluid coming from the vagina.  You have more bleeding instead of less.  Tissue or blood clots come out of your vagina. Get help right away if:  You have severe cramps or pain in your back or abdomen.  Heavy bleeding soaks through 2  large sanitary pads an hour for more than 2 hours.  You become light-headed or weak.  You faint.  You feel sad, and your sadness takes over your thoughts.  You think about hurting yourself. If you ever feel like you may hurt yourself or others, or have thoughts about taking your own life, get help right away. Go to your nearest emergency department or:  Call your local emergency services (911 in the [place]  Call a suicide crisis helpline, such as the National Suicide Prevention Lifeline at (647)536-9323. This is open 24 hours a day in the [place]  Text the Crisis Text Line at (770)275-3669 (in the [place] Summary  Most miscarriages happen in the first 3 months of pregnancy. Sometimes miscarriage happens before a woman knows that she is pregnant.  Follow instructions from your health care provider about medicines and activity.  To help you and your partner with grieving, talk with your health care provider or get counseling.  Keep all follow-up visits. This information is not intended to replace advice given to you by your health care provider. Make sure you discuss any  questions you have with your health care provider. Document Revised: 12/28/2019 Document Reviewed: 12/28/2019 Elsevier Patient Education  2021 Elsevier Inc.        Obstetrics: Normal and Problem Pregnancies (7th ed., pp. 102-121). [place], [place]: Elsevier."> Textbook of Family Medicine (9th ed., pp. [phone]). [place], [place]: Else[employer].">  First Trimester of Pregnancy  The first trimester of pregnancy starts on the first day of your last menstrual period until the end of week 12. This is months 1 through 3 of pregnancy. A week after a sperm fertilizes an egg, the egg will implant into the wall of the uterus and begin to develop into a baby. By the end of 12 weeks, all the baby's organs will be formed and the baby will be 2-3 inches in size. Body changes during your first trimester Your body goes through many changes during pregnancy. The changes vary and generally return to normal after your baby is born. Physical changes  You may gain or lose weight.  Your breasts may begin to grow larger and become tender. The tissue that surrounds your nipples (areola) may become darker.  Dark spots or blotches (chloasma or mask of pregnancy) may develop on your face.  You may have changes in your hair. These can include thickening or thinning of your hair or changes in texture. Health changes  You may feel nauseous, and you may vomit.  You may have heartburn.  You may develop headaches.  You may develop constipation.  Your gums may bleed and may be sensitive to brushing and flossing. Other changes  You may tire easily.  You may urinate more often.  Your menstrual periods will stop.  You may have a loss of appetite.  You may develop cravings for certain kinds of food.  You may have changes in your emotions from day to day.  You may have more vivid and strange dreams. Follow these instructions at home: Medicines  Follow your health care provider's instructions  regarding medicine use. Specific medicines may be either safe or unsafe to take during pregnancy. Do not take any medicines unless told to by your health care provider.  Take a prenatal vitamin that contains at least 600 micrograms (mcg) of folic acid. Eating and drinking  Eat a healthy diet that includes fresh fruits and vegetables, whole grains, good sources of protein such as meat, eggs, or tofu, and  low-fat dairy products.  Avoid raw meat and unpasteurized juice, milk, and cheese. These carry germs that can harm you and your baby.  If you feel nauseous or you vomit: ? Eat 4 or 5 small meals a day instead of 3 large meals. ? Try eating a few soda crackers. ? Drink liquids between meals instead of during meals.  You may need to take these actions to prevent or treat constipation: ? Drink enough fluid to keep your urine pale yellow. ? Eat foods that are high in fiber, such as beans, whole grains, and fresh fruits and vegetables. ? Limit foods that are high in fat and processed sugars, such as fried or sweet foods. Activity  Exercise only as directed by your health care provider. Most people can continue their usual exercise routine during pregnancy. Try to exercise for 30 minutes at least 5 days a week.  Stop exercising if you develop pain or cramping in the lower abdomen or lower back.  Avoid exercising if it is very hot or humid or if you are at high altitude.  Avoid heavy lifting.  If you choose to, you may have sex unless your health care provider tells you not to. Relieving pain and discomfort  Wear a good support bra to relieve breast tenderness.  Rest with your legs elevated if you have leg cramps or low back pain.  If you develop bulging veins (varicose veins) in your legs: ? Wear support hose as told by your health care provider. ? Elevate your feet for 15 minutes, 3-4 times a day. ? Limit salt in your diet. Safety  Wear your seat belt at all times when driving or  riding in a car.  Talk with your health care provider if someone is verbally or physically abusive to you.  Talk with your health care provider if you are feeling sad or have thoughts of hurting yourself. Lifestyle  Do not use hot tubs, steam rooms, or saunas.  Do not douche. Do not use tampons or scented sanitary pads.  Do not use herbal remedies, alcohol, illegal drugs, or medicines that are not approved by your health care provider. Chemicals in these products can harm your baby.  Do not use any products that contain nicotine or tobacco, such as cigarettes, e-cigarettes, and chewing tobacco. If you need help quitting, ask your health care provider.  Avoid cat litter boxes and soil used by cats. These carry germs that can cause birth defects in the baby and possibly loss of the unborn baby (fetus) by miscarriage or stillbirth. General instructions  During routine prenatal visits in the first trimester, your health care provider will do a physical exam, perform necessary tests, and ask you how things are going. Keep all follow-up visits. This is important.  Ask for help if you have counseling or nutritional needs during pregnancy. Your health care provider can offer advice or refer you to specialists for help with various needs.  Schedule a dentist appointment. At home, brush your teeth with a soft toothbrush. Floss gently.  Write down your questions. Take them to your prenatal visits. Where to find more information  American Pregnancy Association: americanpregnancy.org  [employer] and Gynecologists: https://www.todd-brady.net/  Office on [employer] Health: MightyReward.co.nz Contact a health care provider if you have:  Dizziness.  A fever.  Mild pelvic cramps, pelvic pressure, or nagging pain in the abdominal area.  Nausea, vomiting, or diarrhea that lasts for 24 hours or longer.  A bad-smelling vaginal discharge.  Pain when you  urinate.  Known exposure to a contagious illness, such as chickenpox, measles, Zika virus, HIV, or hepatitis. Get help right away if you have:  Spotting or bleeding from your vagina.  Severe abdominal cramping or pain.  Shortness of breath or chest pain.  Any kind of trauma, such as from a fall or a car crash.  New or increased pain, swelling, or redness in an arm or leg. Summary  The first trimester of pregnancy starts on the first day of your last menstrual period until the end of week 12 (months 1 through 3).  Eating 4 or 5 small meals a day rather than 3 large meals may help to relieve nausea and vomiting.  Do not use any products that contain nicotine or tobacco, such as cigarettes, e-cigarettes, and chewing tobacco. If you need help quitting, ask your health care provider.  Keep all follow-up visits. This is important. This information is not intended to replace advice given to you by your health care provider. Make sure you discuss any questions you have with your health care provider. Document Revised: 12/05/2019 Document Reviewed: 10/11/2019 Elsevier Patient Education  2021 Elsevier Inc.       Subchorionic Hematoma  A hematoma is a collection of blood outside of the blood vessels. A subchorionic hematoma is a collection of blood between the outer wall of the embryo (chorion) and the inner wall of the uterus. This condition can cause vaginal bleeding. Early small hematomas usually shrink on their own and do not affect your baby or pregnancy. When bleeding starts later in pregnancy, or if the hematoma is larger or occurs in older pregnant women, the condition may be more serious. Larger hematomas increase the chances of miscarriage. This condition also increases the risk of:  Premature separation of the placenta from the uterus.  Premature (preterm) labor.  Stillbirth. What are the causes? The exact cause of this condition is not known. It occurs when blood is  trapped between the placenta and the uterine wall because the placenta has separated from the original site of implantation. What increases the risk? You are more likely to develop this condition if:  You were treated with fertility medicines.  You became pregnant through in vitro fertilization (IVF). What are the signs or symptoms? Symptoms of this condition include:  Vaginal spotting or bleeding.  Abdominal pain. This is rare. Sometimes you may have no symptoms and the bleeding may only be seen when ultrasound images are taken (transvaginal ultrasound). How is this diagnosed? This condition is diagnosed based on a physical exam. This includes a pelvic exam. You may also have other tests, including:  Blood tests.  Urine tests.  Ultrasound of the abdomen. How is this treated? Treatment for this condition can vary. Treatment may include:  Watchful waiting. You will be monitored closely for any changes in bleeding.  Medicines.  Activity restriction. This may be needed until the bleeding stops.  A medicine called Rh immunoglobulin. This is given if you have an Rh-negative blood type. It prevents Rh sensitization. Follow these instructions at home:  Stay on bed rest if told to do so by your health care provider.  Do not lift anything that is heavier than 10 lb (4.5 kg), or the limit that you are told by your health care provider.  Track and write down the number of pads you use each day and how soaked (saturated) they are.  Do not use tampons.  Keep all follow-up visits. This is  important. Your health care provider may ask you to have follow-up blood tests or ultrasound tests or both. Contact a health care provider if:  You have any vaginal bleeding.  You have a fever. Get help right away if:  You have severe cramps in your stomach, back, abdomen, or pelvis.  You pass large clots or tissue. Save any tissue for your health care provider to look at.  You faint.  You  become light-headed or weak. Summary  A subchorionic hematoma is a collection of blood between the outer wall of the embryo (chorion) and the inner wall of the uterus.  This condition can cause vaginal bleeding.  Sometimes you may have no symptoms and the bleeding may only be seen when ultrasound images are taken.  Treatment may include watchful waiting, medicines, or activity restriction.  Keep all follow-up visits. Get help right away if you have severe cramps or heavy vaginal bleeding. This information is not intended to replace advice given to you by your health care provider. Make sure you discuss any questions you have with your health care provider. Document Revised: 03/24/2020 Document Reviewed: 03/24/2020 Elsevier Patient Education  2021 Elsevier Inc.                         Safe Medications in Pregnancy    Acne: Benzoyl Peroxide Salicylic Acid  Backache/Headache: Tylenol: 2 regular strength every 4 hours OR              2 Extra strength every 6 hours  Colds/Coughs/Allergies: Benadryl (alcohol free) 25 mg every 6 hours as needed Breath right strips Claritin Cepacol throat lozenges Chloraseptic throat spray Cold-Eeze- up to three times per day Cough drops, alcohol free Flonase (by prescription only) Guaifenesin Mucinex Robitussin DM (plain only, alcohol free) Saline nasal spray/drops Sudafed (pseudoephedrine) & Actifed ** use only after [redacted] weeks gestation and if you do not have high blood pressure Tylenol Vicks Vaporub Zinc lozenges Zyrtec   Constipation: Colace Ducolax suppositories Fleet enema Glycerin suppositories Metamucil Milk of magnesia Miralax Senokot Smooth move tea  Diarrhea: Kaopectate Imodium A-D  *NO pepto Bismol  Hemorrhoids: Anusol Anusol HC Preparation H Tucks  Indigestion: Tums Maalox Mylanta Zantac  Pepcid  Insomnia: Benadryl (alcohol free) 25mg  every 6 hours as needed Tylenol PM Unisom, no Gelcaps  Leg  Cramps: Tums MagGel  Nausea/Vomiting:  Bonine Dramamine Emetrol Ginger extract Sea bands Meclizine  Nausea medication to take during pregnancy:  Unisom (doxylamine succinate 25 mg tablets) Take one tablet daily at bedtime. If symptoms are not adequately controlled, the dose can be increased to a maximum recommended dose of two tablets daily (1/2 tablet in the morning, 1/2 tablet mid-afternoon and one at bedtime). Vitamin B6 100mg  tablets. Take one tablet twice a day (up to 200 mg per day).  Skin Rashes: Aveeno products Benadryl cream or 25mg  every 6 hours as needed Calamine Lotion 1% cortisone cream  Yeast infection: Gyne-lotrimin 7 Monistat 7   **If taking multiple medications, please check labels to avoid duplicating the same active ingredients **take medication as directed on the label ** Do not exceed 4000 mg of tylenol in 24 hours **Do not take medications that contain aspirin or ibuprofen          Prenatal Care Providers           [facility] - accepts patients without insurance  Phone: [phone]  [facility]  Phone: [phone]  [facility]  [place]       Phone: [phone]            Center for [facility] @ [facility]     Phone: [phone]          Center for [facility] @ [employer]   Phone: [phone]  Center for [facility] @ [facility] - accepts patients without insurance  Phone: [phone]  Center for [facility] @ [facility] Phone: [phone]     [facility] Department - accepts patients without insurance Phone: [phone]  [facility]  Phone: [phone]  [facility] Phone: [phone]  [facility] Phone: [phone]  [facility] Phone: [phone]  [facility] Phone: [phone]  [facility]  Phone:  [phone]

## 2020-11-15 ENCOUNTER — Inpatient Hospital Stay (HOSPITAL_COMMUNITY)
Admission: AD | Admit: 2020-11-15 | Discharge: 2020-11-15 | Disposition: A | Discharge status: Home / Self Care | Payer: Medicaid Other | Attending: Obstetrics & Gynecology | Admitting: Obstetrics & Gynecology

## 2020-11-15 ENCOUNTER — Other Ambulatory Visit: Payer: Self-pay

## 2020-11-15 DIAGNOSIS — O99281 Endocrine, nutritional and metabolic diseases complicating pregnancy, first trimester: Secondary | ICD-10-CM | POA: Insufficient documentation

## 2020-11-15 DIAGNOSIS — E349 Endocrine disorder, unspecified: Secondary | ICD-10-CM | POA: Diagnosis not present

## 2020-11-15 DIAGNOSIS — Z3401 Encounter for supervision of normal first pregnancy, first trimester: Secondary | ICD-10-CM | POA: Insufficient documentation

## 2020-11-15 DIAGNOSIS — R7989 Other specified abnormal findings of blood chemistry: Secondary | ICD-10-CM

## 2020-11-15 DIAGNOSIS — O0281 Inappropriate change in quantitative human chorionic gonadotropin (hCG) in early pregnancy: Secondary | ICD-10-CM

## 2020-11-15 DIAGNOSIS — Z3A01 Less than 8 weeks gestation of pregnancy: Secondary | ICD-10-CM

## 2020-11-15 LAB — HCG, QUANTITATIVE, PREGNANCY: hCG, Beta Chain, Quant, S: 3573 m[IU]/mL — ABNORMAL HIGH (ref <5)

## 2020-11-15 NOTE — Discharge Instructions (Signed)
Obstetrics: Normal and Problem Pregnancies (7th ed., pp. 102-121). [place], [place]: Elsevier."> Textbook of Family Medicine (9th ed., pp. 365-410). [place], [place]: Elsevier [employer].">  First Trimester of Pregnancy  The first trimester of pregnancy starts on the first day of your last menstrual period until the end of week 12. This is months 1 through 3 of pregnancy. A week after a sperm fertilizes an egg, the egg will implant into the wall of the uterus and begin to develop into a baby. By the end of 12 weeks, all the baby's organs will be formed and the baby will be 2-3 inches in size. Body changes during your first trimester Your body goes through many changes during pregnancy. The changes vary and generally return to normal after your baby is born. Physical changes  You may gain or lose weight.  Your breasts may begin to grow larger and become tender. The tissue that surrounds your nipples (areola) may become darker.  Dark spots or blotches (chloasma or mask of pregnancy) may develop on your face.  You may have changes in your hair. These can include thickening or thinning of your hair or changes in texture. Health changes  You may feel nauseous, and you may vomit.  You may have heartburn.  You may develop headaches.  You may develop constipation.  Your gums may bleed and may be sensitive to brushing and flossing. Other changes  You may tire easily.  You may urinate more often.  Your menstrual periods will stop.  You may have a loss of appetite.  You may develop cravings for certain kinds of food.  You may have changes in your emotions from day to day.  You may have more vivid and strange dreams. Follow these instructions at home: Medicines  Follow your health care provider's instructions regarding medicine use. Specific medicines may be either safe or unsafe to take during pregnancy. Do not take any medicines unless told to by your health care provider.  Take a  prenatal vitamin that contains at least 600 micrograms (mcg) of folic acid. Eating and drinking  Eat a healthy diet that includes fresh fruits and vegetables, whole grains, good sources of protein such as meat, eggs, or tofu, and low-fat dairy products.  Avoid raw meat and unpasteurized juice, milk, and cheese. These carry germs that can harm you and your baby.  If you feel nauseous or you vomit: ? Eat 4 or 5 small meals a day instead of 3 large meals. ? Try eating a few soda crackers. ? Drink liquids between meals instead of during meals.  You may need to take these actions to prevent or treat constipation: ? Drink enough fluid to keep your urine pale yellow. ? Eat foods that are high in fiber, such as beans, whole grains, and fresh fruits and vegetables. ? Limit foods that are high in fat and processed sugars, such as fried or sweet foods. Activity  Exercise only as directed by your health care provider. Most people can continue their usual exercise routine during pregnancy. Try to exercise for 30 minutes at least 5 days a week.  Stop exercising if you develop pain or cramping in the lower abdomen or lower back.  Avoid exercising if it is very hot or humid or if you are at high altitude.  Avoid heavy lifting.  If you choose to, you may have sex unless your health care provider tells you not to. Relieving pain and discomfort  Wear a good support bra to relieve breast   tenderness.  Rest with your legs elevated if you have leg cramps or low back pain.  If you develop bulging veins (varicose veins) in your legs: ? Wear support hose as told by your health care provider. ? Elevate your feet for 15 minutes, 3-4 times a day. ? Limit salt in your diet. Safety  Wear your seat belt at all times when driving or riding in a car.  Talk with your health care provider if someone is verbally or physically abusive to you.  Talk with your health care provider if you are feeling sad or have  thoughts of hurting yourself. Lifestyle  Do not use hot tubs, steam rooms, or saunas.  Do not douche. Do not use tampons or scented sanitary pads.  Do not use herbal remedies, alcohol, illegal drugs, or medicines that are not approved by your health care provider. Chemicals in these products can harm your baby.  Do not use any products that contain nicotine or tobacco, such as cigarettes, e-cigarettes, and chewing tobacco. If you need help quitting, ask your health care provider.  Avoid cat litter boxes and soil used by cats. These carry germs that can cause birth defects in the baby and possibly loss of the unborn baby (fetus) by miscarriage or stillbirth. General instructions  During routine prenatal visits in the first trimester, your health care provider will do a physical exam, perform necessary tests, and ask you how things are going. Keep all follow-up visits. This is important.  Ask for help if you have counseling or nutritional needs during pregnancy. Your health care provider can offer advice or refer you to specialists for help with various needs.  Schedule a dentist appointment. At home, brush your teeth with a soft toothbrush. Floss gently.  Write down your questions. Take them to your prenatal visits. Where to find more information  American Pregnancy Association: americanpregnancy.org  American College of Obstetricians and Gynecologists: acog.org/en/Womens%20Health/Pregnancy  Office on Women's Health: womenshealth.gov/pregnancy Contact a health care provider if you have:  Dizziness.  A fever.  Mild pelvic cramps, pelvic pressure, or nagging pain in the abdominal area.  Nausea, vomiting, or diarrhea that lasts for 24 hours or longer.  A bad-smelling vaginal discharge.  Pain when you urinate.  Known exposure to a contagious illness, such as chickenpox, measles, Zika virus, HIV, or hepatitis. Get help right away if you have:  Spotting or bleeding from your  vagina.  Severe abdominal cramping or pain.  Shortness of breath or chest pain.  Any kind of trauma, such as from a fall or a car crash.  New or increased pain, swelling, or redness in an arm or leg. Summary  The first trimester of pregnancy starts on the first day of your last menstrual period until the end of week 12 (months 1 through 3).  Eating 4 or 5 small meals a day rather than 3 large meals may help to relieve nausea and vomiting.  Do not use any products that contain nicotine or tobacco, such as cigarettes, e-cigarettes, and chewing tobacco. If you need help quitting, ask your health care provider.  Keep all follow-up visits. This is important. This information is not intended to replace advice given to you by your health care provider. Make sure you discuss any questions you have with your health care provider. Document Revised: 12/05/2019 Document Reviewed: 10/11/2019 Elsevier Patient Education  2021 Elsevier Inc.  

## 2020-11-15 NOTE — MAU Note (Signed)
Pt reports to [facility] for follow up labs. Pt reports cramping is the same as when she was in [facility] a few days ago. Denies bleeding.

## 2020-11-15 NOTE — MAU Provider Note (Signed)
History   Chief Complaint:  Follow-up   Ariana Lawrence is  20 y.o. G2P1001 Patient's last menstrual period was 10/07/2020.Marland Kitchen Patient is here for follow up of quantitative HCG and ongoing surveillance of pregnancy status. She is [redacted]w[redacted]d weeks gestation  by LMP.    Since her last visit, the patient is without new complaint. The patient reports bleeding as  none now.  She denies any pain.  General ROS:  negative  Her previous Quantitative HCG values are:  Results for Ariana Lawrence, Ariana Lawrence (MRN [id number]) as of 11/15/2020 14:21  Ref. Range 11/13/2020 11:45  HCG, Beta Chain, Quant, S Latest Ref Range: <5 mIU/mL 1,814 (H)     Physical Exam   Blood pressure 134/69, pulse 68, temperature 98 F (36.7 C), temperature source Oral, resp. rate 16, last menstrual period 10/07/2020, SpO2 100 %.  Physical Exam Constitutional:      General: She is not in acute distress.    Appearance: She is not ill-appearing.  Cardiovascular:     Rate and Rhythm: Normal rate.  Pulmonary:     Effort: Pulmonary effort is normal. No respiratory distress.  Skin:    General: Skin is warm and dry.  Neurological:     Mental Status: She is alert.  Psychiatric:        Behavior: Behavior normal.        Thought Content: Thought content normal.        Judgment: Judgment normal.     Labs: Results for orders placed or performed during the hospital encounter of 11/15/20 (from the past 24 hour(s))  hCG, quantitative, pregnancy   Collection Time: 11/15/20 12:44 PM  Result Value Ref Range   hCG, Beta Chain, Quant, S 3,573 (H) <5 mIU/mL     Assessment:   1. Elevated serum hCG   2. [redacted] weeks gestation of pregnancy     Results reviewed with patient and plan of care discussed. Patient verbalized understanding.   Plan: -Discharge home in stable condition -First trimester precautions discussed -Patient advised to follow-up with [facility] in 7-10 days for repeat ultrasound, order placed -Patient may return to MAU as needed or if her  condition were to change or worsen  Rolm Bookbinder, CNM 11/15/2020, 2:21 PM

## 2020-11-25 ENCOUNTER — Other Ambulatory Visit: Payer: Self-pay

## 2020-11-25 ENCOUNTER — Encounter (HOSPITAL_COMMUNITY): Payer: Self-pay

## 2020-11-25 ENCOUNTER — Emergency Department (HOSPITAL_COMMUNITY)
Admission: EM | Admit: 2020-11-25 | Discharge: 2020-11-25 | Disposition: A | Discharge status: Home / Self Care | Payer: Medicaid Other | Attending: Emergency Medicine | Admitting: Emergency Medicine

## 2020-11-25 ENCOUNTER — Emergency Department (HOSPITAL_COMMUNITY): Payer: Medicaid Other

## 2020-11-25 DIAGNOSIS — O26851 Spotting complicating pregnancy, first trimester: Secondary | ICD-10-CM | POA: Diagnosis not present

## 2020-11-25 DIAGNOSIS — Z3A01 Less than 8 weeks gestation of pregnancy: Secondary | ICD-10-CM | POA: Insufficient documentation

## 2020-11-25 DIAGNOSIS — O26899 Other specified pregnancy related conditions, unspecified trimester: Secondary | ICD-10-CM

## 2020-11-25 DIAGNOSIS — R102 Pelvic and perineal pain: Secondary | ICD-10-CM | POA: Diagnosis not present

## 2020-11-25 DIAGNOSIS — O26891 Other specified pregnancy related conditions, first trimester: Secondary | ICD-10-CM | POA: Diagnosis not present

## 2020-11-25 DIAGNOSIS — R109 Unspecified abdominal pain: Secondary | ICD-10-CM

## 2020-11-25 LAB — BASIC METABOLIC PANEL
Anion gap: 5 (ref 5–15)
BUN: 7 mg/dL (ref 6–20)
CO2: 25 mmol/L (ref 22–32)
Calcium: 8.8 mg/dL — ABNORMAL LOW (ref 8.9–10.3)
Chloride: 106 mmol/L (ref 98–111)
Creatinine, Ser: 0.72 mg/dL (ref 0.44–1.00)
GFR, Estimated: >60 mL/min (ref >60)
Glucose, Bld: 94 mg/dL (ref 70–99)
Potassium: 3.7 mmol/L (ref 3.5–5.1)
Sodium: 136 mmol/L (ref 135–145)

## 2020-11-25 LAB — ABO/RH: ABO/RH(D): A POS

## 2020-11-25 LAB — CBC WITH DIFFERENTIAL/PLATELET
Abs Immature Granulocytes: 0.03 10*3/uL (ref 0.00–0.07)
Basophils Absolute: 0.1 10*3/uL (ref 0.0–0.1)
Basophils Relative: 1 %
Eosinophils Absolute: 0.2 10*3/uL (ref 0.0–0.5)
Eosinophils Relative: 2 %
HCT: 37 % (ref 36.0–46.0)
Hemoglobin: 12.3 g/dL (ref 12.0–15.0)
Immature Granulocytes: 0 %
Lymphocytes Relative: 31 %
Lymphs Abs: 2.9 10*3/uL (ref 0.7–4.0)
MCH: 29.2 pg (ref 26.0–34.0)
MCHC: 33.2 g/dL (ref 30.0–36.0)
MCV: 87.9 fL (ref 80.0–100.0)
Monocytes Absolute: 0.8 10*3/uL (ref 0.1–1.0)
Monocytes Relative: 9 %
Neutro Abs: 5.6 10*3/uL (ref 1.7–7.7)
Neutrophils Relative %: 57 %
Platelets: 323 10*3/uL (ref 150–400)
RBC: 4.21 MIL/uL (ref 3.87–5.11)
RDW: 12.9 % (ref 11.5–15.5)
WBC: 9.6 10*3/uL (ref 4.0–10.5)
nRBC: 0 % (ref 0.0–0.2)

## 2020-11-25 LAB — URINALYSIS, ROUTINE W REFLEX MICROSCOPIC
Bilirubin Urine: NEGATIVE
Glucose, UA: NEGATIVE mg/dL
Hgb urine dipstick: NEGATIVE
Ketones, ur: NEGATIVE mg/dL
Leukocytes,Ua: NEGATIVE
Nitrite: NEGATIVE
Protein, ur: NEGATIVE mg/dL
Specific Gravity, Urine: 1.02 (ref 1.005–1.030)
pH: 8 (ref 5.0–8.0)

## 2020-11-25 LAB — WET PREP, GENITAL
Clue Cells Wet Prep HPF POC: NONE SEEN
Sperm: NONE SEEN
Trich, Wet Prep: NONE SEEN
Yeast Wet Prep HPF POC: NONE SEEN

## 2020-11-25 LAB — HCG, QUANTITATIVE, PREGNANCY: hCG, Beta Chain, Quant, S: 45182 m[IU]/mL — ABNORMAL HIGH (ref <5)

## 2020-11-25 NOTE — ED Triage Notes (Signed)
Patient reports that she is approx [redacted] weeks pregnant. Patient states she has been having intermittent cramping x 2 days. Patient states she has noted light vaginal bleeding since yesterday.

## 2020-11-25 NOTE — ED Notes (Signed)
Ultrasound is at the bedside.

## 2020-11-25 NOTE — ED Notes (Signed)
No vaginal bleeding noted during pelvic exam

## 2020-11-25 NOTE — Discharge Instructions (Addendum)
It was wonderful to see you today.  Ultrasound is showing a fetus in the right place in your uterus.  We have collected a gonorrhea/chlamydia/trichomonas and will follow up with appropriate treatment if positive.  Please make sure you drink plenty of fluids.  Follow-up with OB/GYN for prenatal care.  If you have any severe abdominal pain or vaginal bleeding, please go to the MAU.

## 2020-11-25 NOTE — ED Notes (Signed)
Pelvic exam with swabs done per MD
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
## 2020-11-26 LAB — GC/CHLAMYDIA PROBE AMP (~~LOC~~) NOT AT ARMC
Chlamydia: NEGATIVE
Comment: NEGATIVE
Comment: NORMAL
Neisseria Gonorrhea: NEGATIVE

## 2020-12-03 ENCOUNTER — Telehealth: Payer: Self-pay | Admitting: Student

## 2020-12-03 ENCOUNTER — Other Ambulatory Visit: Payer: Self-pay

## 2020-12-03 ENCOUNTER — Ambulatory Visit
Admission: RE | Admit: 2020-12-03 | Discharge: 2020-12-03 | Disposition: A | Discharge status: Home / Self Care | Payer: Medicaid Other | Source: Ambulatory Visit

## 2020-12-03 DIAGNOSIS — Z3A01 Less than 8 weeks gestation of pregnancy: Secondary | ICD-10-CM | POA: Diagnosis present

## 2020-12-03 DIAGNOSIS — E349 Endocrine disorder, unspecified: Secondary | ICD-10-CM | POA: Insufficient documentation

## 2020-12-03 NOTE — Telephone Encounter (Signed)
Attempted to contact patient regarding her ultrasound results. Left voicemail.   Judeth Horn, NP
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
## 2020-12-18 ENCOUNTER — Other Ambulatory Visit: Payer: Self-pay

## 2020-12-18 ENCOUNTER — Encounter (HOSPITAL_COMMUNITY): Payer: Self-pay | Admitting: Obstetrics and Gynecology

## 2020-12-18 ENCOUNTER — Inpatient Hospital Stay (HOSPITAL_COMMUNITY)
Admission: AD | Admit: 2020-12-18 | Discharge: 2020-12-18 | Disposition: A | Discharge status: Home / Self Care | Payer: Medicaid Other | Attending: Obstetrics and Gynecology | Admitting: Obstetrics and Gynecology

## 2020-12-18 DIAGNOSIS — Z3A10 10 weeks gestation of pregnancy: Secondary | ICD-10-CM

## 2020-12-18 DIAGNOSIS — O209 Hemorrhage in early pregnancy, unspecified: Secondary | ICD-10-CM | POA: Insufficient documentation

## 2020-12-18 LAB — WET PREP, GENITAL
Clue Cells Wet Prep HPF POC: NONE SEEN
Sperm: NONE SEEN
Trich, Wet Prep: NONE SEEN
Yeast Wet Prep HPF POC: NONE SEEN

## 2020-12-18 NOTE — MAU Note (Signed)
Pt reports vaginal bleeding that started yesterday. Pt reports seeing clots.   Reports abdominal pain.
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
## 2020-12-19 LAB — GC/CHLAMYDIA PROBE AMP (~~LOC~~) NOT AT ARMC
Chlamydia: NEGATIVE
Comment: NEGATIVE
Comment: NORMAL
Neisseria Gonorrhea: NEGATIVE

## 2020-12-22 ENCOUNTER — Other Ambulatory Visit (HOSPITAL_COMMUNITY)
Admission: RE | Admit: 2020-12-22 | Discharge: 2020-12-22 | Disposition: A | Discharge status: Home / Self Care | Payer: Medicaid Other | Source: Ambulatory Visit | Attending: Obstetrics and Gynecology | Admitting: Obstetrics and Gynecology

## 2020-12-22 ENCOUNTER — Ambulatory Visit (INDEPENDENT_AMBULATORY_CARE_PROVIDER_SITE_OTHER): Payer: Medicaid Other | Admitting: Obstetrics and Gynecology

## 2020-12-22 ENCOUNTER — Encounter: Payer: Self-pay | Admitting: Obstetrics and Gynecology

## 2020-12-22 ENCOUNTER — Other Ambulatory Visit: Payer: Self-pay

## 2020-12-22 DIAGNOSIS — Z3480 Encounter for supervision of other normal pregnancy, unspecified trimester: Secondary | ICD-10-CM | POA: Diagnosis present

## 2020-12-22 DIAGNOSIS — Z3A10 10 weeks gestation of pregnancy: Secondary | ICD-10-CM | POA: Insufficient documentation

## 2020-12-22 DIAGNOSIS — Z87828 Personal history of other (healed) physical injury and trauma: Secondary | ICD-10-CM

## 2020-12-22 DIAGNOSIS — Z98891 History of uterine scar from previous surgery: Secondary | ICD-10-CM | POA: Insufficient documentation

## 2020-12-22 LAB — HEPATITIS C ANTIBODY: HCV Ab: NEGATIVE

## 2020-12-22 NOTE — Addendum Note (Signed)
Addended by: Isabell Jarvis on: 12/22/2020 04:01 PM   Modules accepted: Orders
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
## 2020-12-22 NOTE — Progress Notes (Signed)
[id number]

## 2020-12-23 ENCOUNTER — Telehealth: Payer: Self-pay | Admitting: Lactation Services

## 2020-12-23 LAB — CERVICOVAGINAL ANCILLARY ONLY
Bacterial Vaginitis (gardnerella): NEGATIVE
Candida Glabrata: NEGATIVE
Candida Vaginitis: POSITIVE — AB
Chlamydia: NEGATIVE
Comment: NEGATIVE
Comment: NEGATIVE
Comment: NEGATIVE
Comment: NEGATIVE
Comment: NEGATIVE
Comment: NORMAL
Neisseria Gonorrhea: NEGATIVE
Trichomonas: NEGATIVE

## 2020-12-23 MED ORDER — TERCONAZOLE 0.4 % VA CREA: 1.0000 | TOPICAL_CREAM | Freq: Every day | VAGINAL | 0 refills | Status: DC

## 2020-12-23 NOTE — Telephone Encounter (Signed)
-----   Message from Warden Fillers, MD sent at 12/23/2020 12:46 PM EDT ----- Yeast noted on vaginal swab, will offer treatment

## 2020-12-23 NOTE — Telephone Encounter (Signed)
Called patient to inform her that she is + for yeast and that Radene Ou is being sent to her pharmacy. Patient voiced understanding and has no questions or concerns at this time.
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
## 2020-12-24 LAB — COMPREHENSIVE METABOLIC PANEL
ALT: 10 IU/L (ref 0–32)
AST: 11 IU/L (ref 0–40)
Albumin/Globulin Ratio: 1.2 (ref 1.2–2.2)
Albumin: 3.9 g/dL (ref 3.9–5.0)
Alkaline Phosphatase: 70 IU/L (ref 44–121)
BUN/Creatinine Ratio: 8 — ABNORMAL LOW (ref 9–23)
BUN: 5 mg/dL — ABNORMAL LOW (ref 6–20)
Bilirubin Total: <0.2 mg/dL (ref 0.0–1.2)
CO2: 20 mmol/L (ref 20–29)
Calcium: 9.5 mg/dL (ref 8.7–10.2)
Chloride: 101 mmol/L (ref 96–106)
Creatinine, Ser: 0.61 mg/dL (ref 0.57–1.00)
Globulin, Total: 3.3 g/dL (ref 1.5–4.5)
Glucose: 75 mg/dL (ref 65–99)
Potassium: 4.2 mmol/L (ref 3.5–5.2)
Sodium: 141 mmol/L (ref 134–144)
Total Protein: 7.2 g/dL (ref 6.0–8.5)
eGFR: 130 mL/min/{1.73_m2} (ref >59)

## 2020-12-24 LAB — RPR: RPR Ser Ql: NONREACTIVE

## 2020-12-24 LAB — CBC
Hematocrit: 39.6 % (ref 34.0–46.6)
Hemoglobin: 13.7 g/dL (ref 11.1–15.9)
MCH: 29.4 pg (ref 26.6–33.0)
MCHC: 34.6 g/dL (ref 31.5–35.7)
MCV: 85 fL (ref 79–97)
Platelets: 337 10*3/uL (ref 150–450)
RBC: 4.66 x10E6/uL (ref 3.77–5.28)
RDW: 13.4 % (ref 11.7–15.4)
WBC: 11.6 10*3/uL — ABNORMAL HIGH (ref 3.4–10.8)

## 2020-12-24 LAB — HIV ANTIBODY (ROUTINE TESTING W REFLEX): HIV Screen 4th Generation wRfx: NONREACTIVE

## 2020-12-24 LAB — HEPATITIS C ANTIBODY: Hep C Virus Ab: <0.1 s/co ratio (ref 0.0–0.9)

## 2020-12-24 LAB — RUBELLA SCREEN: Rubella Antibodies, IGG: 1.56 index (ref >0.99)

## 2020-12-24 LAB — HEPATITIS B SURFACE ANTIGEN: Hepatitis B Surface Ag: NEGATIVE

## 2020-12-25 LAB — CYTOLOGY - PAP
Chlamydia: NEGATIVE
Comment: NEGATIVE
Comment: NORMAL
Diagnosis: NEGATIVE
Diagnosis: REACTIVE
Neisseria Gonorrhea: NEGATIVE

## 2021-01-05 ENCOUNTER — Telehealth: Payer: Self-pay | Admitting: Lactation Services

## 2021-01-05 NOTE — Telephone Encounter (Signed)
Patient called and LM that she is calling about results and would like a call back to discuss.

## 2021-01-06 NOTE — Telephone Encounter (Signed)
Called pt; VM left stating pt can call the office or send MyChart message if she still has concerns.

## 2021-01-07 ENCOUNTER — Encounter: Payer: Self-pay | Admitting: *Deleted

## 2021-01-15 ENCOUNTER — Encounter: Payer: Self-pay | Admitting: *Deleted

## 2021-01-16 ENCOUNTER — Encounter: Payer: Self-pay | Admitting: *Deleted

## 2021-01-20 ENCOUNTER — Encounter: Payer: Medicaid Other | Admitting: Student

## 2021-03-04 ENCOUNTER — Encounter: Payer: Medicaid Other | Admitting: Family Medicine

## 2021-03-06 ENCOUNTER — Encounter: Payer: Medicaid Other | Admitting: Family Medicine

## 2021-03-27 ENCOUNTER — Encounter: Payer: Medicaid Other | Admitting: Family

## 2021-05-12 ENCOUNTER — Other Ambulatory Visit: Payer: Self-pay

## 2021-05-12 ENCOUNTER — Ambulatory Visit (INDEPENDENT_AMBULATORY_CARE_PROVIDER_SITE_OTHER): Payer: Medicaid Other | Admitting: Nurse Practitioner

## 2021-05-12 VITALS — BP 123/64 | HR 87 | Wt 299.0 lb

## 2021-05-12 DIAGNOSIS — Z3A31 31 weeks gestation of pregnancy: Secondary | ICD-10-CM

## 2021-05-12 DIAGNOSIS — Z98891 History of uterine scar from previous surgery: Secondary | ICD-10-CM

## 2021-05-12 DIAGNOSIS — Z3480 Encounter for supervision of other normal pregnancy, unspecified trimester: Secondary | ICD-10-CM
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
## 2021-05-12 NOTE — Patient Instructions (Addendum)
Call Transportation Service at [phone]     BRAINSTORMING  Develop a Plan Goals: Provide a way to start conversation about your new life with a baby Assist parents in recognizing and using resources within their reach Help pave the way before birth for an easier period of transition afterwards.  Make a list of the following information to keep in a central location: Full name of Mom and Partner: _____________________________________________ 84 full name and Date of Birth: ___________________________________________ Home Address: ___________________________________________________________ ________________________________________________________________________ Home Phone: ____________________________________________________________ Parents' cell numbers: _____________________________________________________ ________________________________________________________________________ Name and contact info for OB: ______________________________________________ Name and contact info for Pediatrician:________________________________________ Contact info for Lactation Consultants: ________________________________________  REST and SLEEP *You each need at least 4-5 hours of uninterrupted sleep every day. Write specific names and contact information.* How are you going to rest in the postpartum period? While partner's home? When partner returns to work? When you both return to work? Where will your baby sleep? Who is available to help during the day? Evening? Night? Who could move in for a period to help support you? What are some ideas to help you get enough sleep? __________________________________________________________________________________________________________________________________________________________________________________________________________________________________________ NUTRITIOUS FOOD AND DRINK *Plan for meals before your baby is born so you can have healthy  food to eat during the immediate postpartum period.* Who will look after breakfast? Lunch? Dinner? List names and contact information. Brainstorm quick, healthy ideas for each meal. What can you do before baby is born to prepare meals for the postpartum period? How can others help you with meals? Which grocery stores provide online shopping and delivery? Which restaurants offer take-out or delivery options? ______________________________________________________________________________________________________________________________________________________________________________________________________________________________________________________________________________________________________________________________________________________________________________________________________  CARE FOR MOM *It's important that mom is cared for and pampered in the postpartum period. Remember, the most important ways new mothers need care are: sleep, nutrition, gentle exercise, and time off.* Who can come take care of mom during this period? Make a list of people with their contact information. List some activities that make you feel cared for, rested, and energized? Who can make sure you have opportunities to do these things? Does mom have a space of her very own within your home that's just for her? Make a "[facility]" where she can be comfortable, rest, and renew herself daily. ______________________________________________________________________________________________________________________________________________________________________________________________________________________________________________________________________________________________________________________________________________________________________________________________________    CARE FOR AND FEEDING BABY *Knowledgeable and encouraging people will offer the best support with regard to feeding your baby.* Educate  yourself and choose the best feeding option for your baby. Make a list of people who will guide, support, and be a resource for you as your care for and feed your baby. (Friends that have breastfed or are currently breastfeeding, lactation consultants, breastfeeding support groups, etc.) Consider a postpartum doula. (These websites can give you information: dona.org & BuyingShow.es) Seek out local breastfeeding resources like the breastfeeding support group at [employer] or [employer]. ______________________________________________________________________________________________________________________________________________________________________________________________________________________________________________________________________________________________________________________________________________________________________________________________________  Verner Chol AND ERRANDS Who can help with a thorough cleaning before baby is born? Make a list of people who will help with housekeeping and chores, like laundry, light cleaning, dishes, bathrooms, etc. Who can run some errands for you? What can you do to make sure you are stocked with basic supplies before baby is born? Who is going to do the shopping? ______________________________________________________________________________________________________________________________________________________________________________________________________________________________________________________________________________________________________________________________________________________________________________________________________     Family Adjustment *Nurture yourselves.it helps parents be more loving and allows for better bonding with their child.* What sorts of things do you and partner enjoy doing together? Which activities help you to connect and strengthen your relationship? Make a list of those things.  Make a list of people  whom you trust to care for your baby so you can have some time together as a couple. What types of things help partner feel connected to Mom? Make a list. What needs will partner have in order to bond with baby? Other children? Who will care for them when you go into labor and while you are in the hospital? Think about what the needs of your older children might be. Who can help you meet those needs? In what ways are you helping them prepare for bringing baby home? List some specific strategies you have for family adjustment. _______________________________________________________________________________________________________________________________________________________________________________________________________________________________________________________________________________________________________________________________________________  SUPPORT *Someone who can empathize with experiences normalizes your problems and makes them more bearable.* Make a list of other friends, neighbors, and/or co-workers you know with infants (and small children, if applicable) with whom you can connect. Make a list of local or online support groups, mom groups, etc. in which you can be involved. ______________________________________________________________________________________________________________________________________________________________________________________________________________________________________________________________________________________________________________________________________________________________________________________________________  Childcare Plans Investigate and plan for childcare if mom is returning to work. Talk about mom's concerns about her transition back to work. Talk about partner's concerns regarding this transition.  Mental Health *Your mental health is one of the highest priorities for a pregnant or postpartum mom.* 1 in 5 women  experience anxiety and/or depression from the time of conception through the first year after birth. Postpartum Mood Disorders are the #1 complication of pregnancy and childbirth and the suffering experienced by these mothers is not necessary! These illnesses are temporary and respond well to treatment, which often includes self-care, social support, talk therapy, and medication when needed. Women experiencing anxiety and depression often say things like: "I'm supposed to be happy.why do I feel so sad?", "Why can't I snap out of it?", "I'm having thoughts that scare me." There is no need to be embarrassed if you are feeling these symptoms: Overwhelmed, anxious, angry, sad, guilty, irritable, hopeless, exhausted but can't sleep You are NOT alone. You are NOT to blame. With help, you WILL be well. Where can I find help? Medical professionals such as your OB, midwife, gynecologist, family practitioner, primary care provider, pediatrician, or mental health providers; [facility] support groups: Feelings After Birth, Breastfeeding Support Group, Baby and Me Group, and Fit 4 Two exercise classes. You have permission to ask for help. It will confirm your feelings, validate your experiences, share/learn coping strategies, and gain support and encouragement as you heal. You are important! BRAINSTORM Make a list of local resources, including resources for mom and for partner. Identify support groups. Identify people to call late at night - include names and contact info. Talk with partner about perinatal mood and anxiety disorders. Talk with your OB, midwife, and doula about baby blues and about perinatal mood and anxiety disorders. Talk with your pediatrician about perinatal mood and anxiety disorders.   Support & Sanity Savers   What do you really need?  Basics In preparing for a new baby, many expectant parents spend hours shopping for baby clothes, decorating the nursery, and deciding which car  seat to buy. Yet most don't think much about what the reality of parenting a newborn will be like, and what they need to make it through that. So, here is the advice of experienced parents. We know you'll read this, and think "they're exaggerating, I don't really need that." Just trust [place] on these, OK? Plan for all of this, and if it turns out you don't need it, come back and teach [place] how you  did it!  Must-Haves (Once baby's survival needs are met, make sure you attend to your own survival needs!) Sleep An average newborn sleeps 16-18 hours per day, over 6-7 sleep periods, rarely more than three hours at a time. It is normal and healthy for a newborn to wake throughout the night... but really hard on parents!! Naps. Prioritize sleep above any responsibilities like: cleaning house, visiting friends, running errands, etc.  Sleep whenever baby sleeps. If you can't nap, at least have restful times when baby eats. The more rest you get, the more patient you will be, the more emotionally stable, and better at solving problems.  Food You may not have realized it would be difficult to eat when you have a newborn. Yet, when we talk to countless new parents, they say things like "it may be 2:00 pm when I realize I haven't had breakfast yet." Or "every time we sit down to dinner, baby needs to eat, and my food gets cold, so I don't bother to eat it." Finger food. Before your baby is born, stock up with one months' worth of food that: 1) you can eat with one hand while holding a baby, 2) doesn't need to be prepped, 3) is good hot or cold, 4) doesn't spoil when left out for a few hours, and 5) you like to eat. Think about: nuts, dried fruit, Clif bars, pretzels, jerky, gogurt, baby carrots, apples, bananas, crackers, cheez-n-crackers, string cheese, hot pockets or frozen burritos to microwave, garden burgers and breakfast pastries to put in the toaster, yogurt drinks, etc. Restaurant Menus. Make lists of your favorite  restaurants & menu items. When family/friends want to help, you can give specific information without much thought. They can either bring you the food or send gift cards for just the right meals. Freezer Meals.  Take some time to make a few meals to put in the freezer ahead of time.  Easy to freeze meals can be anything such as soup, lasagna, chicken pie, or spaghetti sauce. Set up a Meal Schedule.  Ask friends and family to sign up to bring you meals during the first few weeks of being home. (It can be passed around at baby showers!) You have no idea how helpful this will be until you are in the throes of parenting.  https://hamilton-woodard.[email or web address]/ is a great website to check out. Emotional Support Know who to call when you're stressed out. Parenting a newborn is very challenging work. There are times when it totally overwhelms your normal coping abilities. EVERY NEW PARENT NEEDS TO HAVE A PLAN FOR WHO TO CALL WHEN THEY JUST CAN'T COPE ANY MORE. (And it has to be someone other than the baby's other parent!) Before your baby is born, come up with at least one person you can call for support - write their phone number down and post it on the refrigerator. Anxiety & Sadness. Baby blues are normal after pregnancy; however, there are more severe types of anxiety & sadness which can occur and should not be ignored.  They are always treatable, but you have to take the first step by reaching out for help. [facility] offers a "Mom Talk" group which meets every Tuesday from 10 am - 11 am.  This group is for new moms who need support and connection after their babies are born.  Call [phone].  Really, Really Helpful (Plan for them! Make sure these happen often!!) Physical Support with Taking Care of Yourselves Asking friends and family. Before your  baby is born, set up a schedule of people who can come and visit and help out (or ask a friend to schedule for you). Any time someone says "let me know what I can do to  help," sign them up for a day. When they get there, their job is not to take care of the baby (that's your job and your joy). Their job is to take care of you!  Postpartum doulas. If you don't have anyone you can call on for support, look into postpartum doulas:  professionals at helping parents with caring for baby, caring for themselves, getting breastfeeding started, and helping with household tasks. www.padanc.org is a helpful website for learning about doulas in our area. Peer Support / Parent Groups Why: One of the greatest ideas for new parents is to be around other new parents. Parent groups give you a chance to share and listen to others who are going through the same season of life, get a sense of what is normal infant development by watching several babies learn and grow, share your stories of triumph and struggles with empathetic ears, and forgive your own mistakes when you realize all parents are learning by trial and error. Where to find: There are many places you can meet other new parents throughout our community.  [facility] offers the following classes for new moms and their little ones:  Baby and Me (Birth to [place]) and Breastfeeding Support Group. Go to www.conehealthybaby.com or call [phone] for more information. Time for your Relationship It's easy to get so caught up in meeting baby's immediate needs that it's hard to find time to connect with your partner, and meet the needs of your relationship. It's also easy to forget what "quality time with your partner" actually looks like. If you take your baby on a date, you'd be amazed how much of your couple time is spent feeding the baby, diapering the baby, admiring the baby, and talking about the baby. Dating: Try to take time for just the two of you. Babysitter tip: Sometimes when moms are breastfeeding a newborn, they find it hard to figure out how to schedule outings around baby's unpredictable feeding schedules. Have the  babysitter come for a three hour period. When she comes over, if baby has just eaten, you can leave right away, and come back in two hours. If baby hasn't fed recently, you start the date at home. Once baby gets hungry and gets a good feeding in, you can head out for the rest of your date time. Date Nights at Home: If you can't get out, at least set aside one evening a week to prioritize your relationship: whenever baby dozes off or doesn't have any immediate needs, spend a little time focusing on each other. Potential conflicts: The main relationship conflicts that come up for new parents are: issues related to sexuality, financial stresses, a feeling of an unfair division of household tasks, and conflicts in parenting styles. The more you can work on these issues before baby arrives, the better!  Fun and Frills (Don't forget these. and don't feel guilty for indulging in them!) Everyone has something in life that is a fun little treat that they do just for themselves. It may be: reading the morning paper, or going for a daily jog, or having coffee with a friend once a week, or going to a movie on Friday nights, or fine chocolates, or bubble baths, or curling up with a good book. Unless you do fun  things for yourself every now and then, it's hard to have the energy for fun with your baby. Whatever your "special" treats are, make sure you find a way to continue to indulge in them after your baby is born. These special moments can recharge you, and allow you to return to baby with a new joy   PERINATAL MOOD DISORDERS: [place]   _________________________________________Emergency and Crisis Resources If you are an imminent risk to self or others, are experiencing intense personal distress, and/or have noticed significant changes in activities of daily living, call:  [place]: [phone]  [address],  [place], [place], [address] Mobile Crisis: [place]: [phone] Or visit the following crisis centers: Local Emergency Departments [facility]: [address], [place]. Hours: 8:30AM-5PM. Insurance Accepted: Medicaid, Medicare, and Uninsured.  [facility]:  [address], [place]  Mon-Friday 8am-3pm, [phone]                                                                                  ___________ Non-Crisis Resources To identify specific providers that are covered by your insurance, contact your insurance company or local agencies:  [place]: [phone] [facility] [employer]: [place]: [phone] Postpartum Support International- Warm-line: 272-331-1221                                                      __Outpatient Therapy and Medication Management   Providers:  [facility]: [phone] Hours: 9AM-5PM  Insurance Accepted: Alben Spittle, Shane Crutch, [place], [place] [facility] of Care): [phone] Hours: 8AM-5:30PM  nsurance Accepted: All insurances EXCEPT AARP, [place], [place], and [place]: [phone] Hours: 8AM-8PM Insurance Accepted: Cristal Ford, Freddrick March, [place], Medicare, [phone] [facility]: [phone] Hours: 8:30AM-7PM Insurance Accepted: Cristal Ford, Medicaid, Medicare, Tricare, [employer] Counseling:  [place] Accepted:  Holland Falling, Lorella Nimrod, [employer], [place]: [phone] Hours: 9AM-5:30PM Insurance Accepted: Alben Spittle, Charlotte Crumb, and Medicaid, Medicare, [facility]:  [phone] Hours: 9am-5pm Insurance Accepted: BCBS; they do not  accept Medicaid/Medicare [facility] [place]: [phone] Hours: 9am-9pm Insurance Accepted: All major insurance including Medicaid and Medicare [facility]: [phone] Hours: [place] Accepted: All insurances EXCEPT Medicaid and Medicare. [facility]: [phone]   ____________                                                                     Parenting [place]: [phone] [place]:  [place]  Network: (support for children in the NICU and/or with special needs), [phone]   ___________                                                                 Mental Health Support Groups Mental Health Association: [phone]    _____________                                                                                  Online Resources Postpartum Support International: http://jones-berg.com/  800-944-4PPD 2Moms Supporting Moms:  www.momssupportingmoms.net

## 2021-05-13 ENCOUNTER — Other Ambulatory Visit: Payer: Medicaid Other

## 2021-05-13 ENCOUNTER — Other Ambulatory Visit: Payer: Self-pay | Admitting: *Deleted

## 2021-05-13 DIAGNOSIS — Z3480 Encounter for supervision of other normal pregnancy, unspecified trimester: Secondary | ICD-10-CM

## 2021-05-14 LAB — ANTIBODY SCREEN: Antibody Screen: NEGATIVE

## 2021-05-14 LAB — CBC
Hematocrit: 37.8 % (ref 34.0–46.6)
Hemoglobin: 13.1 g/dL (ref 11.1–15.9)
MCH: 30 pg (ref 26.6–33.0)
MCHC: 34.7 g/dL (ref 31.5–35.7)
MCV: 87 fL (ref 79–97)
Platelets: 258 10*3/uL (ref 150–450)
RBC: 4.37 x10E6/uL (ref 3.77–5.28)
RDW: 13.1 % (ref 11.7–15.4)
WBC: 10.1 10*3/uL (ref 3.4–10.8)

## 2021-05-14 LAB — GLUCOSE TOLERANCE, 2 HOURS W/ 1HR
Glucose, 1 hour: 91 mg/dL (ref 70–179)
Glucose, 2 hour: 90 mg/dL (ref 70–152)
Glucose, Fasting: 82 mg/dL (ref 70–91)

## 2021-05-14 LAB — ABO/RH: Rh Factor: POSITIVE

## 2021-05-14 LAB — HIV ANTIBODY (ROUTINE TESTING W REFLEX): HIV Screen 4th Generation wRfx: NONREACTIVE

## 2021-05-14 LAB — RPR: RPR Ser Ql: NONREACTIVE

## 2021-05-18 ENCOUNTER — Inpatient Hospital Stay (HOSPITAL_BASED_OUTPATIENT_CLINIC_OR_DEPARTMENT_OTHER): Payer: Medicaid Other

## 2021-05-18 ENCOUNTER — Encounter (HOSPITAL_COMMUNITY): Payer: Self-pay | Admitting: Family Medicine

## 2021-05-18 ENCOUNTER — Inpatient Hospital Stay (HOSPITAL_COMMUNITY)
Admission: AD | Admit: 2021-05-18 | Discharge: 2021-05-18 | Disposition: A | Discharge status: Home / Self Care | Payer: Medicaid Other | Attending: Family Medicine | Admitting: Family Medicine

## 2021-05-18 DIAGNOSIS — Z3A31 31 weeks gestation of pregnancy: Secondary | ICD-10-CM

## 2021-05-18 DIAGNOSIS — O368130 Decreased fetal movements, third trimester, not applicable or unspecified: Secondary | ICD-10-CM | POA: Diagnosis not present

## 2021-05-18 DIAGNOSIS — Z3480 Encounter for supervision of other normal pregnancy, unspecified trimester: Secondary | ICD-10-CM

## 2021-05-18 DIAGNOSIS — O368131 Decreased fetal movements, third trimester, fetus 1: Secondary | ICD-10-CM | POA: Insufficient documentation

## 2021-05-18 DIAGNOSIS — O34219 Maternal care for unspecified type scar from previous cesarean delivery: Secondary | ICD-10-CM

## 2021-05-18 DIAGNOSIS — Z3689 Encounter for other specified antenatal screening: Secondary | ICD-10-CM

## 2021-05-18 DIAGNOSIS — O0933 Supervision of pregnancy with insufficient antenatal care, third trimester: Secondary | ICD-10-CM | POA: Diagnosis not present

## 2021-05-18 DIAGNOSIS — Z7982 Long term (current) use of aspirin: Secondary | ICD-10-CM | POA: Diagnosis not present

## 2021-05-18 DIAGNOSIS — O0930 Supervision of pregnancy with insufficient antenatal care, unspecified trimester: Secondary | ICD-10-CM | POA: Insufficient documentation

## 2021-05-18 LAB — URINALYSIS, ROUTINE W REFLEX MICROSCOPIC
Bilirubin Urine: NEGATIVE
Glucose, UA: NEGATIVE mg/dL
Hgb urine dipstick: NEGATIVE
Ketones, ur: NEGATIVE mg/dL
Leukocytes,Ua: NEGATIVE
Nitrite: NEGATIVE
Protein, ur: NEGATIVE mg/dL
Specific Gravity, Urine: 1.003 — ABNORMAL LOW (ref 1.005–1.030)
pH: 7 (ref 5.0–8.0)

## 2021-05-18 LAB — RAPID URINE DRUG SCREEN, HOSP PERFORMED
Amphetamines: NOT DETECTED
Barbiturates: NOT DETECTED
Benzodiazepines: NOT DETECTED
Cocaine: NOT DETECTED
Opiates: NOT DETECTED
Tetrahydrocannabinol: NOT DETECTED

## 2021-05-18 MED ORDER — LACTATED RINGERS IV BOLUS
1000.0000 mL | Freq: Once | INTRAVENOUS | Status: AC
  Administered 2021-05-18: 1000 mL via INTRAVENOUS

## 2021-05-18 NOTE — MAU Note (Signed)
Urine in lab 

## 2021-05-18 NOTE — MAU Note (Signed)
.  Ariana Lawrence is a 20 y.o. at 104w6d here in MAU reporting: decreased fetal movement for the last 3 hours. She states she called the office and she has done all of the things they told her to do. She still has not felt the 10 movements. Denies VB or LOF.   Pain score: 0 Vitals:   05/18/21 1623  BP: 126/62  Pulse: 75  Resp: 15  Temp: 97.8 F (36.6 C)  SpO2: 99%     FHT:131 Lab orders placed from triage:  UA
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
## 2021-05-27 ENCOUNTER — Other Ambulatory Visit: Payer: Self-pay

## 2021-05-27 ENCOUNTER — Ambulatory Visit (INDEPENDENT_AMBULATORY_CARE_PROVIDER_SITE_OTHER): Payer: Medicaid Other | Admitting: Family Medicine

## 2021-05-27 VITALS — BP 115/62 | HR 74 | Wt 296.0 lb

## 2021-05-27 DIAGNOSIS — Z3480 Encounter for supervision of other normal pregnancy, unspecified trimester: Secondary | ICD-10-CM

## 2021-05-27 DIAGNOSIS — Z87828 Personal history of other (healed) physical injury and trauma: Secondary | ICD-10-CM

## 2021-05-27 DIAGNOSIS — Z98891 History of uterine scar from previous surgery: Secondary | ICD-10-CM

## 2021-05-27 DIAGNOSIS — O0933 Supervision of pregnancy with insufficient antenatal care, third trimester: Secondary | ICD-10-CM
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
## 2021-05-28 ENCOUNTER — Other Ambulatory Visit: Payer: Self-pay | Admitting: Obstetrics and Gynecology

## 2021-05-28 DIAGNOSIS — Z98891 History of uterine scar from previous surgery: Secondary | ICD-10-CM

## 2021-06-11 ENCOUNTER — Other Ambulatory Visit: Payer: Self-pay

## 2021-06-11 ENCOUNTER — Encounter: Payer: Self-pay | Admitting: Obstetrics and Gynecology

## 2021-06-11 ENCOUNTER — Ambulatory Visit (INDEPENDENT_AMBULATORY_CARE_PROVIDER_SITE_OTHER): Payer: Medicaid Other | Admitting: Obstetrics and Gynecology

## 2021-06-11 VITALS — BP 108/62 | HR 83 | Wt 295.3 lb

## 2021-06-11 DIAGNOSIS — Z3A35 35 weeks gestation of pregnancy: Secondary | ICD-10-CM

## 2021-06-11 DIAGNOSIS — Z3480 Encounter for supervision of other normal pregnancy, unspecified trimester: Secondary | ICD-10-CM

## 2021-06-11 DIAGNOSIS — Z98891 History of uterine scar from previous surgery: Secondary | ICD-10-CM
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
## 2021-06-11 NOTE — Patient Instructions (Signed)

## 2021-06-22 ENCOUNTER — Ambulatory Visit (INDEPENDENT_AMBULATORY_CARE_PROVIDER_SITE_OTHER): Payer: Medicaid Other | Admitting: Family Medicine

## 2021-06-22 ENCOUNTER — Other Ambulatory Visit: Payer: Self-pay

## 2021-06-22 ENCOUNTER — Other Ambulatory Visit (HOSPITAL_COMMUNITY)
Admission: RE | Admit: 2021-06-22 | Discharge: 2021-06-22 | Disposition: A | Discharge status: Home / Self Care | Payer: Medicaid Other | Source: Ambulatory Visit | Attending: Family Medicine | Admitting: Family Medicine

## 2021-06-22 VITALS — BP 114/64 | HR 82 | Wt 297.5 lb

## 2021-06-22 DIAGNOSIS — Z3689 Encounter for other specified antenatal screening: Secondary | ICD-10-CM

## 2021-06-22 DIAGNOSIS — Z3480 Encounter for supervision of other normal pregnancy, unspecified trimester: Secondary | ICD-10-CM | POA: Diagnosis present

## 2021-06-22 DIAGNOSIS — Z3A36 36 weeks gestation of pregnancy: Secondary | ICD-10-CM

## 2021-06-22 DIAGNOSIS — Z98891 History of uterine scar from previous surgery: Secondary | ICD-10-CM

## 2021-06-22 DIAGNOSIS — Z5941 Food insecurity: Secondary | ICD-10-CM

## 2021-06-22 LAB — OB RESULTS CONSOLE GC/CHLAMYDIA: Gonorrhea: NEGATIVE

## 2021-06-22 NOTE — Progress Notes (Signed)
Patient follow up ultrasound scheduled for 06/29/21 at 3:15pm. Patient notified.
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
## 2021-06-23 LAB — CERVICOVAGINAL ANCILLARY ONLY
Chlamydia: NEGATIVE
Comment: NEGATIVE
Comment: NORMAL
Neisseria Gonorrhea: NEGATIVE

## 2021-06-26 LAB — CULTURE, BETA STREP (GROUP B ONLY): Strep Gp B Culture: NEGATIVE

## 2021-06-29 ENCOUNTER — Ambulatory Visit: Payer: Medicaid Other

## 2021-06-30 ENCOUNTER — Ambulatory Visit: Payer: Medicaid Other | Attending: Obstetrics and Gynecology

## 2021-06-30 ENCOUNTER — Ambulatory Visit: Payer: Medicaid Other | Admitting: *Deleted

## 2021-06-30 ENCOUNTER — Ambulatory Visit (INDEPENDENT_AMBULATORY_CARE_PROVIDER_SITE_OTHER): Payer: Medicaid Other | Admitting: Student

## 2021-06-30 ENCOUNTER — Encounter: Payer: Self-pay | Admitting: *Deleted

## 2021-06-30 ENCOUNTER — Other Ambulatory Visit: Payer: Self-pay

## 2021-06-30 VITALS — BP 123/69 | HR 78 | Wt 298.9 lb

## 2021-06-30 VITALS — BP 125/62 | HR 79

## 2021-06-30 DIAGNOSIS — Z3480 Encounter for supervision of other normal pregnancy, unspecified trimester: Secondary | ICD-10-CM

## 2021-06-30 DIAGNOSIS — Z3689 Encounter for other specified antenatal screening: Secondary | ICD-10-CM | POA: Diagnosis present

## 2021-06-30 DIAGNOSIS — O0933 Supervision of pregnancy with insufficient antenatal care, third trimester: Secondary | ICD-10-CM | POA: Insufficient documentation

## 2021-06-30 DIAGNOSIS — Z1331 Encounter for screening for depression: Secondary | ICD-10-CM

## 2021-06-30 DIAGNOSIS — Z3A38 38 weeks gestation of pregnancy: Secondary | ICD-10-CM

## 2021-06-30 DIAGNOSIS — O34219 Maternal care for unspecified type scar from previous cesarean delivery: Secondary | ICD-10-CM

## 2021-06-30 DIAGNOSIS — O2693 Pregnancy related conditions, unspecified, third trimester: Secondary | ICD-10-CM | POA: Insufficient documentation
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
## 2021-06-30 NOTE — Patient Instructions (Addendum)
Ariana Lawrence  06/30/2021   Your procedure is scheduled on:  07/08/2021  Arrive at 0915 at Entrance C on [address] at [facility]  and [employer]. You are invited to use the FREE valet parking or use the Visitor's parking deck.  Pick up the phone at the desk and dial [phone].  Call this number if you have problems the morning of surgery: [phone]  Remember:   Do not eat food:(After Midnight) Desps de medianoche.  Do not drink clear liquids: (After Midnight) Desps de medianoche.  Take these medicines the morning of surgery with A SIP OF WATER:  none   Do not wear jewelry, make-up or nail polish.  Do not wear lotions, powders, or perfumes. Do not wear deodorant.  Do not shave 48 hours prior to surgery.  Do not bring valuables to the hospital.  [facility] is not   responsible for any belongings or valuables brought to the hospital.  Contacts, dentures or bridgework may not be worn into surgery.  Leave suitcase in the car. After surgery it may be brought to your room.  For patients admitted to the hospital, checkout time is 11:00 AM the day of              discharge.      Please read over the following fact sheets that you were given:     Preparing for Surgery

## 2021-07-01 ENCOUNTER — Telehealth: Payer: Self-pay | Admitting: Clinical

## 2021-07-01 ENCOUNTER — Encounter (HOSPITAL_COMMUNITY): Payer: Self-pay

## 2021-07-01 NOTE — Telephone Encounter (Signed)
Attempt to schedule pt, per referral; Left HIPPA-compliant message to call back Coburn Knaus from [facility] at  [phone] (Elivia Robotham's office); left MyChart message for pt.  °

## 2021-07-07 ENCOUNTER — Encounter (HOSPITAL_COMMUNITY): Payer: Self-pay | Admitting: Obstetrics & Gynecology

## 2021-07-07 ENCOUNTER — Other Ambulatory Visit (HOSPITAL_COMMUNITY)
Admission: RE | Admit: 2021-07-07 | Discharge: 2021-07-07 | Disposition: A | Discharge status: Home / Self Care | Payer: Medicaid Other | Source: Ambulatory Visit | Attending: Family Medicine | Admitting: Family Medicine

## 2021-07-07 ENCOUNTER — Other Ambulatory Visit: Payer: Self-pay

## 2021-07-07 DIAGNOSIS — Z98891 History of uterine scar from previous surgery: Secondary | ICD-10-CM

## 2021-07-07 DIAGNOSIS — Z01812 Encounter for preprocedural laboratory examination: Secondary | ICD-10-CM | POA: Insufficient documentation

## 2021-07-07 HISTORY — DX: Other reaction to spinal and lumbar puncture: G97.1

## 2021-07-07 LAB — CBC
HCT: 41.2 % (ref 36.0–46.0)
Hemoglobin: 14 g/dL (ref 12.0–15.0)
MCH: 29.4 pg (ref 26.0–34.0)
MCHC: 34 g/dL (ref 30.0–36.0)
MCV: 86.4 fL (ref 80.0–100.0)
Platelets: 266 10*3/uL (ref 150–400)
RBC: 4.77 MIL/uL (ref 3.87–5.11)
RDW: 13.2 % (ref 11.5–15.5)
WBC: 10.3 10*3/uL (ref 4.0–10.5)
nRBC: 0 % (ref 0.0–0.2)

## 2021-07-07 LAB — TYPE AND SCREEN
ABO/RH(D): A POS
Antibody Screen: NEGATIVE

## 2021-07-07 NOTE — Anesthesia Preprocedure Evaluation (Addendum)
Anesthesia Evaluation  Patient identified by MRN, date of birth, ID band Patient awake    Reviewed: Allergy & Precautions, NPO status , Patient's Chart, lab work & pertinent test results  Airway Mallampati: II  TM Distance: >3 FB Neck ROM: Full    Dental no notable dental hx. (+) Teeth Intact, Dental Advisory Given   Pulmonary neg pulmonary ROS,    Pulmonary exam normal breath sounds clear to auscultation       Cardiovascular Exercise Tolerance: Good Normal cardiovascular exam Rhythm:Regular Rate:Normal     Neuro/Psych  Headaches, Anxiety    GI/Hepatic negative GI ROS, Neg liver ROS,   Endo/Other  Morbid obesity (BMI 40.42)  Renal/GU negative Renal ROS     Musculoskeletal   Abdominal (+) + obese,   Peds  Hematology Lab Results      Component                Value               Date                      WBC                      10.3                07/07/2021                HGB                      14.0                07/07/2021                HCT                      41.2                07/07/2021                MCV                      86.4                07/07/2021                PLT                      266                 07/07/2021              Anesthesia Other Findings   Reproductive/Obstetrics (+) Pregnancy                            Anesthesia Physical Anesthesia Plan  ASA: 3  Anesthesia Plan: Spinal   Post-op Pain Management: Regional block and Toradol IV (intra-op)   Induction:   PONV Risk Score and Plan: 3 and Treatment may vary due to age or medical condition, Ondansetron and Dexamethasone  Airway Management Planned: Natural Airway and Simple Face Mask  Additional Equipment: None  Intra-op Plan:   Post-operative Plan:   Informed Consent: I have reviewed the patients History and Physical, chart, labs and discussed the procedure including the risks, benefits and  alternatives for the proposed anesthesia with the patient  or authorized representative who has indicated his/her understanding and acceptance.     Dental advisory given  Plan Discussed with: CRNA and Anesthesiologist  Anesthesia Plan Comments: (Spinal)       Anesthesia Quick Evaluation

## 2021-07-08 ENCOUNTER — Inpatient Hospital Stay (HOSPITAL_COMMUNITY): Payer: Medicaid Other | Admitting: Anesthesiology

## 2021-07-08 ENCOUNTER — Inpatient Hospital Stay (HOSPITAL_COMMUNITY)
Admission: RE | Admit: 2021-07-08 | Discharge: 2021-07-10 | DRG: 787 | Disposition: A | Discharge status: Home / Self Care | Payer: Medicaid Other | Attending: Obstetrics & Gynecology | Admitting: Obstetrics & Gynecology

## 2021-07-08 ENCOUNTER — Encounter (HOSPITAL_COMMUNITY): Payer: Self-pay | Admitting: Obstetrics & Gynecology

## 2021-07-08 ENCOUNTER — Encounter (HOSPITAL_COMMUNITY)
Admission: RE | Disposition: A | Discharge status: Home / Self Care | Payer: Self-pay | Source: Home / Self Care | Attending: Obstetrics & Gynecology

## 2021-07-08 DIAGNOSIS — D62 Acute posthemorrhagic anemia: Secondary | ICD-10-CM | POA: Diagnosis not present

## 2021-07-08 DIAGNOSIS — Z20822 Contact with and (suspected) exposure to covid-19: Secondary | ICD-10-CM | POA: Diagnosis present

## 2021-07-08 DIAGNOSIS — O34211 Maternal care for low transverse scar from previous cesarean delivery: Secondary | ICD-10-CM

## 2021-07-08 DIAGNOSIS — Z3A39 39 weeks gestation of pregnancy: Secondary | ICD-10-CM

## 2021-07-08 DIAGNOSIS — Z87828 Personal history of other (healed) physical injury and trauma: Secondary | ICD-10-CM

## 2021-07-08 DIAGNOSIS — O9081 Anemia of the puerperium: Secondary | ICD-10-CM | POA: Diagnosis not present

## 2021-07-08 DIAGNOSIS — O99214 Obesity complicating childbirth: Secondary | ICD-10-CM | POA: Diagnosis present

## 2021-07-08 DIAGNOSIS — O0930 Supervision of pregnancy with insufficient antenatal care, unspecified trimester: Secondary | ICD-10-CM

## 2021-07-08 DIAGNOSIS — O0933 Supervision of pregnancy with insufficient antenatal care, third trimester: Secondary | ICD-10-CM

## 2021-07-08 DIAGNOSIS — Z98891 History of uterine scar from previous surgery: Secondary | ICD-10-CM

## 2021-07-08 DIAGNOSIS — Z3480 Encounter for supervision of other normal pregnancy, unspecified trimester: Secondary | ICD-10-CM

## 2021-07-08 LAB — RESP PANEL BY RT-PCR (FLU A&B, COVID) ARPGX2
Influenza A by PCR: NEGATIVE
Influenza B by PCR: NEGATIVE
SARS Coronavirus 2 by RT PCR: NEGATIVE

## 2021-07-08 LAB — RPR: RPR Ser Ql: NONREACTIVE

## 2021-07-08 SURGERY — Surgical Case
Anesthesia: Spinal

## 2021-07-08 MED ORDER — DEXAMETHASONE SODIUM PHOSPHATE 4 MG/ML IJ SOLN
INTRAMUSCULAR | Status: AC
  Filled 2021-07-08: qty 1

## 2021-07-08 MED ORDER — LACTATED RINGERS IV SOLN: INTRAVENOUS | Status: DC | PRN

## 2021-07-08 MED ORDER — DIPHENHYDRAMINE HCL 25 MG PO CAPS: 25.0000 mg | ORAL_CAPSULE | Freq: Four times a day (QID) | ORAL | Status: DC | PRN

## 2021-07-08 MED ORDER — OXYTOCIN-SODIUM CHLORIDE 30-0.9 UT/500ML-% IV SOLN
2.5000 [IU]/h | INTRAVENOUS | Status: AC
  Administered 2021-07-08: 15:00:00 2.5 [IU]/h via INTRAVENOUS

## 2021-07-08 MED ORDER — PHENYLEPHRINE HCL-NACL 20-0.9 MG/250ML-% IV SOLN
INTRAVENOUS | Status: AC
  Filled 2021-07-08: qty 250

## 2021-07-08 MED ORDER — STERILE WATER FOR IRRIGATION IR SOLN
Status: DC | PRN
  Administered 2021-07-08: 1000 mL

## 2021-07-08 MED ORDER — NALOXONE HCL 0.4 MG/ML IJ SOLN: 0.4000 mg | INTRAMUSCULAR | Status: DC | PRN

## 2021-07-08 MED ORDER — COCONUT OIL OIL: 1.0000 "application " | TOPICAL_OIL | Status: DC | PRN

## 2021-07-08 MED ORDER — MORPHINE SULFATE (PF) 0.5 MG/ML IJ SOLN
INTRAMUSCULAR | Status: DC | PRN
  Administered 2021-07-08: 150 ug via INTRATHECAL

## 2021-07-08 MED ORDER — FENTANYL CITRATE (PF) 100 MCG/2ML IJ SOLN
INTRAMUSCULAR | Status: DC | PRN
  Administered 2021-07-08: 15 ug via INTRATHECAL

## 2021-07-08 MED ORDER — ACETAMINOPHEN 500 MG PO TABS
1000.0000 mg | ORAL_TABLET | Freq: Four times a day (QID) | ORAL | Status: DC
  Administered 2021-07-08 – 2021-07-10 (×8): 1000 mg via ORAL
  Filled 2021-07-08 (×8): qty 2

## 2021-07-08 MED ORDER — PHENYLEPHRINE 40 MCG/ML (10ML) SYRINGE FOR IV PUSH (FOR BLOOD PRESSURE SUPPORT): PREFILLED_SYRINGE | INTRAVENOUS | Status: DC | PRN

## 2021-07-08 MED ORDER — SIMETHICONE 80 MG PO CHEW
80.0000 mg | CHEWABLE_TABLET | Freq: Three times a day (TID) | ORAL | Status: DC
  Administered 2021-07-08 – 2021-07-10 (×6): 80 mg via ORAL
  Filled 2021-07-08 (×6): qty 1

## 2021-07-08 MED ORDER — OXYTOCIN-SODIUM CHLORIDE 30-0.9 UT/500ML-% IV SOLN
INTRAVENOUS | Status: DC | PRN
  Administered 2021-07-08: 400 mL via INTRAVENOUS

## 2021-07-08 MED ORDER — SODIUM CHLORIDE 0.9 % IR SOLN
Status: DC | PRN
  Administered 2021-07-08: 1

## 2021-07-08 MED ORDER — IBUPROFEN 600 MG PO TABS
600.0000 mg | ORAL_TABLET | Freq: Four times a day (QID) | ORAL | Status: DC
  Administered 2021-07-08 – 2021-07-10 (×8): 600 mg via ORAL
  Filled 2021-07-08 (×8): qty 1

## 2021-07-08 MED ORDER — CEFAZOLIN IN SODIUM CHLORIDE 3-0.9 GM/100ML-% IV SOLN
3.0000 g | INTRAVENOUS | Status: AC
  Administered 2021-07-08: 11:00:00 3 g via INTRAVENOUS

## 2021-07-08 MED ORDER — DIPHENHYDRAMINE HCL 50 MG/ML IJ SOLN: 12.5000 mg | INTRAMUSCULAR | Status: DC | PRN

## 2021-07-08 MED ORDER — ENOXAPARIN SODIUM 80 MG/0.8ML IJ SOSY
65.0000 mg | PREFILLED_SYRINGE | INTRAMUSCULAR | Status: DC
  Administered 2021-07-09 – 2021-07-10 (×2): 65 mg via SUBCUTANEOUS
  Filled 2021-07-08 (×2): qty 0.8

## 2021-07-08 MED ORDER — DEXAMETHASONE SODIUM PHOSPHATE 10 MG/ML IJ SOLN
INTRAMUSCULAR | Status: DC | PRN
  Administered 2021-07-08: 4 mg via INTRAVENOUS

## 2021-07-08 MED ORDER — PHENYLEPHRINE HCL-NACL 20-0.9 MG/250ML-% IV SOLN
INTRAVENOUS | Status: DC | PRN
  Administered 2021-07-08: 60 ug/min via INTRAVENOUS

## 2021-07-08 MED ORDER — MORPHINE SULFATE (PF) 0.5 MG/ML IJ SOLN
INTRAMUSCULAR | Status: AC
  Filled 2021-07-08: qty 10

## 2021-07-08 MED ORDER — SCOPOLAMINE 1 MG/3DAYS TD PT72: 1.0000 | MEDICATED_PATCH | Freq: Once | TRANSDERMAL | Status: DC

## 2021-07-08 MED ORDER — POVIDONE-IODINE 10 % EX SWAB: 2.0000 "application " | Freq: Once | CUTANEOUS | Status: DC

## 2021-07-08 MED ORDER — DIPHENHYDRAMINE HCL 25 MG PO CAPS: 25.0000 mg | ORAL_CAPSULE | ORAL | Status: DC | PRN

## 2021-07-08 MED ORDER — SODIUM CHLORIDE 0.9% FLUSH: 3.0000 mL | INTRAVENOUS | Status: DC | PRN

## 2021-07-08 MED ORDER — ONDANSETRON HCL 4 MG/2ML IJ SOLN
INTRAMUSCULAR | Status: DC | PRN
  Administered 2021-07-08: 4 mg via INTRAVENOUS

## 2021-07-08 MED ORDER — CEFAZOLIN IN SODIUM CHLORIDE 3-0.9 GM/100ML-% IV SOLN
INTRAVENOUS | Status: AC
  Filled 2021-07-08: qty 100

## 2021-07-08 MED ORDER — DIBUCAINE (PERIANAL) 1 % EX OINT: 1.0000 "application " | TOPICAL_OINTMENT | CUTANEOUS | Status: DC | PRN

## 2021-07-08 MED ORDER — KETOROLAC TROMETHAMINE 30 MG/ML IJ SOLN
30.0000 mg | Freq: Once | INTRAMUSCULAR | Status: AC | PRN
  Administered 2021-07-08: 13:00:00 30 mg via INTRAVENOUS

## 2021-07-08 MED ORDER — WITCH HAZEL-GLYCERIN EX PADS: 1.0000 "application " | MEDICATED_PAD | CUTANEOUS | Status: DC | PRN

## 2021-07-08 MED ORDER — MENTHOL 3 MG MT LOZG: 1.0000 | LOZENGE | OROMUCOSAL | Status: DC | PRN

## 2021-07-08 MED ORDER — MEPERIDINE HCL 25 MG/ML IJ SOLN: 6.2500 mg | INTRAMUSCULAR | Status: DC | PRN

## 2021-07-08 MED ORDER — ONDANSETRON HCL 4 MG/2ML IJ SOLN: 4.0000 mg | Freq: Once | INTRAMUSCULAR | Status: DC | PRN

## 2021-07-08 MED ORDER — OXYCODONE HCL 5 MG/5ML PO SOLN: 5.0000 mg | Freq: Once | ORAL | Status: DC | PRN

## 2021-07-08 MED ORDER — SIMETHICONE 80 MG PO CHEW: 80.0000 mg | CHEWABLE_TABLET | ORAL | Status: DC | PRN

## 2021-07-08 MED ORDER — KETOROLAC TROMETHAMINE 30 MG/ML IJ SOLN
INTRAMUSCULAR | Status: AC
  Filled 2021-07-08: qty 1

## 2021-07-08 MED ORDER — ONDANSETRON HCL 4 MG/2ML IJ SOLN
INTRAMUSCULAR | Status: AC
  Filled 2021-07-08: qty 2

## 2021-07-08 MED ORDER — OXYCODONE HCL 5 MG PO TABS: 5.0000 mg | ORAL_TABLET | Freq: Four times a day (QID) | ORAL | Status: DC | PRN

## 2021-07-08 MED ORDER — OXYTOCIN-SODIUM CHLORIDE 30-0.9 UT/500ML-% IV SOLN
INTRAVENOUS | Status: AC
  Filled 2021-07-08: qty 500

## 2021-07-08 MED ORDER — ONDANSETRON HCL 4 MG/2ML IJ SOLN
4.0000 mg | Freq: Three times a day (TID) | INTRAMUSCULAR | Status: DC | PRN
  Administered 2021-07-08: 19:00:00 4 mg via INTRAVENOUS
  Filled 2021-07-08: qty 2

## 2021-07-08 MED ORDER — HYDROMORPHONE HCL 1 MG/ML IJ SOLN: 0.2500 mg | INTRAMUSCULAR | Status: DC | PRN

## 2021-07-08 MED ORDER — TETANUS-DIPHTH-ACELL PERTUSSIS 5-2.5-18.5 LF-MCG/0.5 IM SUSY: 0.5000 mL | PREFILLED_SYRINGE | Freq: Once | INTRAMUSCULAR | Status: DC

## 2021-07-08 MED ORDER — OXYCODONE HCL 5 MG PO TABS: 5.0000 mg | ORAL_TABLET | Freq: Once | ORAL | Status: DC | PRN

## 2021-07-08 MED ORDER — FENTANYL CITRATE (PF) 100 MCG/2ML IJ SOLN
INTRAMUSCULAR | Status: AC
  Filled 2021-07-08: qty 2

## 2021-07-08 MED ORDER — PRENATAL MULTIVITAMIN CH
1.0000 | ORAL_TABLET | Freq: Every day | ORAL | Status: DC
  Administered 2021-07-09 – 2021-07-10 (×2): 1 via ORAL
  Filled 2021-07-08 (×2): qty 1

## 2021-07-08 MED ORDER — BUPIVACAINE IN DEXTROSE 0.75-8.25 % IT SOLN
INTRATHECAL | Status: DC | PRN
  Administered 2021-07-08: 13.5 mg via INTRATHECAL

## 2021-07-08 MED ORDER — NALOXONE HCL 4 MG/10ML IJ SOLN
1.0000 ug/kg/h | INTRAVENOUS | Status: DC | PRN
  Filled 2021-07-08: qty 5

## 2021-07-08 MED ORDER — SENNOSIDES-DOCUSATE SODIUM 8.6-50 MG PO TABS
2.0000 | ORAL_TABLET | Freq: Every day | ORAL | Status: DC
  Administered 2021-07-09 – 2021-07-10 (×2): 2 via ORAL
  Filled 2021-07-08 (×2): qty 2

## 2021-07-08 SURGICAL SUPPLY — 38 items
CHLORAPREP W/TINT 26ML (MISCELLANEOUS) ×2 IMPLANT
CLAMP CORD UMBIL (MISCELLANEOUS) IMPLANT
CLOTH BEACON ORANGE TIMEOUT ST (SAFETY) ×2 IMPLANT
DERMABOND ADVANCED (GAUZE/BANDAGES/DRESSINGS) ×1
DERMABOND ADVANCED .7 DNX12 (GAUZE/BANDAGES/DRESSINGS) ×2 IMPLANT
DRSG OPSITE POSTOP 4X10 (GAUZE/BANDAGES/DRESSINGS) ×2 IMPLANT
ELECT REM PT RETURN 9FT ADLT (ELECTROSURGICAL) ×2
ELECTRODE REM PT RTRN 9FT ADLT (ELECTROSURGICAL) ×1 IMPLANT
GLOVE BIOGEL PI IND STRL 7.0 (GLOVE) ×1 IMPLANT
GLOVE BIOGEL PI IND STRL 8 (GLOVE) ×1 IMPLANT
GLOVE BIOGEL PI INDICATOR 7.0 (GLOVE) ×1
GLOVE BIOGEL PI INDICATOR 8 (GLOVE) ×1
GLOVE ECLIPSE 8.0 STRL XLNG CF (GLOVE) ×2 IMPLANT
GOWN STRL REUS W/TWL LRG LVL3 (GOWN DISPOSABLE) ×4 IMPLANT
KIT ABG SYR 3ML LUER SLIP (SYRINGE) ×2 IMPLANT
NDL HYPO 18GX1.5 BLUNT FILL (NEEDLE) ×1 IMPLANT
NDL HYPO 25X5/8 SAFETYGLIDE (NEEDLE) ×1 IMPLANT
NEEDLE HYPO 18GX1.5 BLUNT FILL (NEEDLE) ×2 IMPLANT
NEEDLE HYPO 22GX1.5 SAFETY (NEEDLE) ×2 IMPLANT
NEEDLE HYPO 25X5/8 SAFETYGLIDE (NEEDLE) ×2 IMPLANT
NS IRRIG 1000ML POUR BTL (IV SOLUTION) ×2 IMPLANT
PACK C SECTION WH (CUSTOM PROCEDURE TRAY) ×2 IMPLANT
PAD OB MATERNITY 4.3X12.25 (PERSONAL CARE ITEMS) ×2 IMPLANT
PENCIL SMOKE EVAC W/HOLSTER (ELECTROSURGICAL) ×2 IMPLANT
RTRCTR C-SECT PINK 25CM LRG (MISCELLANEOUS) IMPLANT
SUT CHROMIC 0 CT 1 (SUTURE) ×2 IMPLANT
SUT MNCRL 0 VIOLET CTX 36 (SUTURE) ×2 IMPLANT
SUT MONOCRYL 0 CTX 36 (SUTURE) ×2
SUT PLAIN 2 0 (SUTURE)
SUT PLAIN 2 0 XLH (SUTURE) IMPLANT
SUT PLAIN ABS 2-0 CT1 27XMFL (SUTURE) IMPLANT
SUT VIC AB 0 CTX 36 (SUTURE) ×1
SUT VIC AB 0 CTX36XBRD ANBCTRL (SUTURE) ×1 IMPLANT
SUT VIC AB 4-0 KS 27 (SUTURE) IMPLANT
SYR 20CC LL (SYRINGE) ×4 IMPLANT
TOWEL OR 17X24 6PK STRL BLUE (TOWEL DISPOSABLE) ×2 IMPLANT
TRAY FOLEY W/BAG SLVR 14FR LF (SET/KITS/TRAYS/PACK) IMPLANT
WATER STERILE IRR 1000ML POUR (IV SOLUTION) ×2 IMPLANT
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
## 2021-07-08 NOTE — Transfer of Care (Signed)
Immediate Anesthesia Transfer of Care Note  Patient: Ariana Lawrence  Procedure(s) Performed: CESAREAN SECTION  Patient Location: PACU  Anesthesia Type:Spinal  Level of Consciousness: awake  Airway & Oxygen Therapy: Patient Spontanous Breathing  Post-op Assessment: Report given to RN and Post -op Vital signs reviewed and stable  Post vital signs: Reviewed and stable  Last Vitals:  Vitals Value Taken Time  BP 111/57 07/08/21 1222  Temp    Pulse 64 07/08/21 1224  Resp 15 07/08/21 1224  SpO2 100 % 07/08/21 1224  Vitals shown include unvalidated device data.  Last Pain:  Vitals:   07/08/21 0927  TempSrc: Oral  PainSc: 0-No pain         Complications: No notable events documented.

## 2021-07-08 NOTE — Lactation Note (Signed)
This note was copied from a baby's chart. Lactation Consultation Note  Patient Name: Ariana Lawrence EQAST'M Date: 07/08/2021 Reason for consult: Initial assessment;Term Age:22 hours LC entered the room mom was concerned that she did not have any colostrum. Per mom, she had breast changes in her pregnancy and LC used breast model and mom self expressed colostrum.  LC explained colostrum is enough and discussed infant's small tummy size the first few days of life. Mom latched infant on her left breast using the football hold position, infant latched with depth  and after 15 minutes, mom switched  infant to her left breast using the football hold position, infant was still breastfeeding when [facility] left the room. LC discussed breastfeeding stimulation techniques to keep infant awake and active feeding such as: gently stroking infant's neck, shoulder and hands, doing breast compressions and talking to infant. Mom made aware of O/P services, breastfeeding support groups, community resources, and our phone # for post-discharge questions.   Mom's current plans: 1-Mom will breastfeed infant according to feeding cues, 8 to 12 or more times,  skin to skin.  2-Mom will try breastfeed infant on both breast during a feeding. 3- Mom knows to call RN/LC if she has any breastfeeding questions, concerns or if she need further assistance with latching infant at the breast.  Maternal Data Has patient been taught Hand Expression?: Yes Does the patient have breastfeeding experience prior to this delivery?: Yes How long did the patient breastfeed?: Per mom, shr breastfeed her 1st daughter who is now 87 years old for 3 months.  Feeding Mother's Current Feeding Choice: Breast Milk Nipple Type: Slow - flow  LATCH Score Latch: Grasps breast easily, tongue down, lips flanged, rhythmical sucking.  Audible Swallowing: A few with stimulation  Type of Nipple: Everted at rest and after stimulation  Comfort  (Breast/Nipple): Soft / non-tender  Hold (Positioning): Assistance needed to correctly position infant at breast and maintain latch.  LATCH Score: 8   Lactation Tools Discussed/Used    Interventions Interventions: Breast feeding basics reviewed;Assisted with latch;Skin to skin;Hand express;Breast compression;Adjust position;Support pillows;Position options;Expressed milk;Education;LC Services brochure  Discharge    Consult Status Consult Status: Follow-up Date: 07/09/21 Follow-up type: In-patient    Danelle Earthly 07/08/2021, 4:05 PM

## 2021-07-08 NOTE — Op Note (Signed)
Cesarean Section Procedure Note   [name]  07/08/2021  Indications:  scheduled repeat cesarean section  Pre-operative Diagnosis: RCS.   Post-operative Diagnosis: Same   Surgeon: Surgeon(s) and Role:    * Eure, Amaryllis Dyke, MD - Primary    * Warner Mccreedy, MD - Assisting    Anesthesia: spinal    Estimated Blood Loss: 300 mL   Total IV Fluids:   Urine Output: 200cc of clear urine   Findings: Normal tubes and ovaries  Baby condition / location:  Couplet care / Skin to Skin 3470 gm APGAR: 89, ; weight 3470gm .     Complications: no complications  Indications: Ariana Lawrence is a 20 y.o. [phone] with an IUP [redacted]w[redacted]d presenting  for scheduled cesarean section The risks, benefits, complications, treatment options, and expected outcomes were discussed with the patient . The patient concurred with the proposed plan, giving informed consent. identified as [name] and the procedure verified as C-Section Delivery.  Procedure Details: A Time Out was held and the above information confirmed.  The patient was taken back to the operative suite where spinal anesthesia was placed.  After induction of anesthesia, the patient was draped and prepped in the usual sterile manner and placed in a dorsal supine position with a leftward tilt. A low transverse incision was made at site of last cesarean incision and carried down through the subcutaneous tissue to the fascia. Fascial incision was made and extended transversely. The fascia was separated from the underlying rectus tissue superiorly and inferiorly. The peritoneum was identified and entered. Peritoneal incision was extended longitudinally. The utero-vesical peritoneal reflection was incised transversely and the bladder flap was bluntly freed from the lower uterine segment. A low transverse uterine incision was made. Delivered from cephalic presentation. Cord ph was not sent the umbilical cord was clamped and cut cord blood was obtained  for evaluation. The placenta was removed Intact and appeared normal. The uterine outline, tubes and ovaries appeared normal}. The uterine incision was closed with running locked sutures of 0 PDS.   Hemostasis was observed. Lavage was carried out until clear. The fascia was then reapproximated with running sutures of 0Vicryl. The subcuticular closure was performed using 2-0plain gut. The skin was closed with 4-0Vicryl.   Instrument, sponge, and needle counts were correct prior the abdominal closure and were correct at the conclusion of the case.     Disposition: PACU - hemodynamically stable.   Maternal Condition: stable       Signed: Warner Mccreedy, MD, MPH OB Fellow, Faculty [facility]

## 2021-07-08 NOTE — Anesthesia Postprocedure Evaluation (Signed)
Anesthesia Post Note  Patient: Ariana Lawrence  Procedure(s) Performed: CESAREAN SECTION     Patient location during evaluation: Mother Baby Anesthesia Type: Spinal Level of consciousness: oriented and awake and alert Pain management: pain level controlled Vital Signs Assessment: post-procedure vital signs reviewed and stable Respiratory status: spontaneous breathing and respiratory function stable Cardiovascular status: blood pressure returned to baseline and stable Postop Assessment: no headache, no backache, no apparent nausea or vomiting and able to ambulate Anesthetic complications: no   No notable events documented.  Last Vitals:  Vitals:   07/08/21 1300 07/08/21 1315  BP: 132/76 127/65  Pulse: 64 64  Resp: 14 18  Temp:    SpO2: 100% 100%    Last Pain:  Vitals:   07/08/21 1245  TempSrc:   PainSc: 0-No pain   Pain Goal:    LLE Motor Response: Purposeful movement (07/08/21 1245) LLE Sensation: Tingling (07/08/21 1245) RLE Motor Response: Purposeful movement (07/08/21 1245) RLE Sensation: Tingling (07/08/21 1245) L Sensory Level: S1-Sole of foot, small toes (07/08/21 1315) R Sensory Level: S1-Sole of foot, small toes (07/08/21 1315) Epidural/Spinal Function Cutaneous sensation: Normal sensation (07/08/21 1300), Patient able to flex knees: Yes (07/08/21 1300), Patient able to lift hips off bed: Yes (07/08/21 1300), Back pain beyond tenderness at insertion site: No (07/08/21 1300), Progressively worsening motor and/or sensory loss: No (07/08/21 1300)  Trevor Iha
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
## 2021-07-09 LAB — CBC
HCT: 33.7 % — ABNORMAL LOW (ref 36.0–46.0)
Hemoglobin: 11.4 g/dL — ABNORMAL LOW (ref 12.0–15.0)
MCH: 29.1 pg (ref 26.0–34.0)
MCHC: 33.8 g/dL (ref 30.0–36.0)
MCV: 86 fL (ref 80.0–100.0)
Platelets: 242 10*3/uL (ref 150–400)
RBC: 3.92 MIL/uL (ref 3.87–5.11)
RDW: 13.2 % (ref 11.5–15.5)
WBC: 17.1 10*3/uL — ABNORMAL HIGH (ref 4.0–10.5)
nRBC: 0 % (ref 0.0–0.2)
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
## 2021-07-09 NOTE — Progress Notes (Signed)
POSTPARTUM PROGRESS NOTE  POD #1  Subjective:  Ariana Lawrence is a 20 y.o. [id number] s/p rLTCS at [redacted]w[redacted]d. No acute events overnight. She reports she is doing well. She denies any problems with ambulating, voiding or po intake. Denies nausea or vomiting. She has  passed flatus. Pain is well controlled.  Lochia is mild.  Objective: Blood pressure 105/60, pulse 68, temperature 97.6 F (36.4 C), temperature source Oral, resp. rate 18, height 6' (1.829 m), weight 136 kg, last menstrual period 10/07/2020, SpO2 98 %, unknown if currently breastfeeding.  Physical Exam:  General: alert, cooperative and no distress Chest: no respiratory distress Heart: regular rate, distal pulses intact Uterine Fundus: firm, appropriately tender DVT Evaluation: No calf swelling or tenderness Extremities: minimal edema Skin: warm, dry; incision clean/dry/intact w/ honeycomb dressing in place  Recent Labs    07/07/21 1005 07/09/21 0522  HGB 14.0 11.4*  HCT 41.2 33.7*    Assessment/Plan: Ariana Lawrence is a 20 y.o. G2P2002 s/p rLTCS at [redacted]w[redacted]d.  POD#1 - Doing welll; pain well controlled.   Routine postpartum care  OOB, ambulated  Lovenox for VTE prophylaxis Acute blood loss Anemia: asymptomatic, cont to monitor.   Contraception: plans for nexplanon  Feeding: breast  Dispo: Plan for discharge tomorrow or following day.   LOS: 1 day   Leticia Penna, DO OB Fellow  07/09/2021, 8:33 AM
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
## 2021-07-10 ENCOUNTER — Encounter (HOSPITAL_COMMUNITY): Payer: Self-pay | Admitting: Obstetrics & Gynecology

## 2021-07-10 MED ORDER — ACETAMINOPHEN 500 MG PO TABS: 1000.0000 mg | ORAL_TABLET | Freq: Four times a day (QID) | ORAL | 0 refills | Status: AC

## 2021-07-10 MED ORDER — IBUPROFEN 600 MG PO TABS: 600.0000 mg | ORAL_TABLET | Freq: Four times a day (QID) | ORAL | 0 refills | Status: AC

## 2021-07-10 MED ORDER — OXYCODONE HCL 5 MG PO TABS: 5.0000 mg | ORAL_TABLET | Freq: Four times a day (QID) | ORAL | 0 refills | Status: AC | PRN

## 2021-07-15 ENCOUNTER — Ambulatory Visit: Payer: Medicaid Other

## 2021-07-21 ENCOUNTER — Telehealth (HOSPITAL_COMMUNITY): Payer: Self-pay | Admitting: *Deleted

## 2021-07-21 NOTE — Telephone Encounter (Signed)
Attempted hospital discharge follow-up call. Left message for patient to return RN call. Deforest Hoyles, RN, 07/21/21, [phone]

## 2021-08-06 ENCOUNTER — Ambulatory Visit: Payer: Medicaid Other | Admitting: Student

## 2021-10-06 ENCOUNTER — Ambulatory Visit: Payer: Medicaid Other | Admitting: Family Medicine

## 2021-10-12 ENCOUNTER — Ambulatory Visit: Payer: Medicaid Other | Admitting: Medical
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
## 2022-06-29 ENCOUNTER — Emergency Department (HOSPITAL_COMMUNITY): Payer: Medicaid Other

## 2022-06-29 ENCOUNTER — Emergency Department (HOSPITAL_COMMUNITY)
Admission: EM | Admit: 2022-06-29 | Discharge: 2022-06-30 | Disposition: A | Discharge status: Home / Self Care | Payer: Medicaid Other | Attending: Emergency Medicine | Admitting: Emergency Medicine

## 2022-06-29 ENCOUNTER — Encounter (HOSPITAL_COMMUNITY): Payer: Self-pay

## 2022-06-29 ENCOUNTER — Other Ambulatory Visit: Payer: Self-pay

## 2022-06-29 DIAGNOSIS — R079 Chest pain, unspecified: Secondary | ICD-10-CM | POA: Diagnosis present

## 2022-06-29 DIAGNOSIS — M795 Residual foreign body in soft tissue: Secondary | ICD-10-CM

## 2022-06-29 DIAGNOSIS — Z1810 Retained metal fragments, unspecified: Secondary | ICD-10-CM | POA: Diagnosis not present
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
## 2022-06-29 NOTE — ED Notes (Signed)
Patient transported to X-ray 
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
## 2022-08-02 ENCOUNTER — Ambulatory Visit: Payer: Medicaid Other | Admitting: Obstetrics and Gynecology

## 2022-09-20 ENCOUNTER — Other Ambulatory Visit: Payer: Self-pay

## 2022-09-20 ENCOUNTER — Emergency Department (HOSPITAL_COMMUNITY): Payer: Medicaid Other

## 2022-09-20 ENCOUNTER — Emergency Department (HOSPITAL_COMMUNITY)
Admission: EM | Admit: 2022-09-20 | Discharge: 2022-09-21 | Disposition: A | Discharge status: Home / Self Care | Payer: Medicaid Other | Attending: Emergency Medicine | Admitting: Emergency Medicine

## 2022-09-20 DIAGNOSIS — G510 Bell's palsy: Secondary | ICD-10-CM

## 2022-09-20 DIAGNOSIS — R202 Paresthesia of skin: Secondary | ICD-10-CM | POA: Diagnosis present
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
## 2022-09-20 NOTE — ED Triage Notes (Signed)
Patient reports right facial paralysis 3 days ago , no numbness or loss of sensation . Speech clear .

## 2022-09-21 MED ORDER — VALACYCLOVIR HCL 1 G PO TABS: 1000.0000 mg | ORAL_TABLET | Freq: Three times a day (TID) | ORAL | 0 refills | Status: AC

## 2022-09-21 MED ORDER — PREDNISONE 20 MG PO TABS: ORAL_TABLET | ORAL | 0 refills | Status: AC

## 2022-09-21 MED ORDER — VALACYCLOVIR HCL 500 MG PO TABS
1000.0000 mg | ORAL_TABLET | Freq: Three times a day (TID) | ORAL | Status: DC
  Administered 2022-09-21: 1000 mg via ORAL
  Filled 2022-09-21: qty 2

## 2022-09-21 MED ORDER — PREDNISONE 20 MG PO TABS
60.0000 mg | ORAL_TABLET | Freq: Once | ORAL | Status: AC
  Administered 2022-09-21: 60 mg via ORAL
  Filled 2022-09-21 (×2): qty 3
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
## 2022-09-21 NOTE — Discharge Instructions (Addendum)
Use over-the-counter artificial tears to ensure lubrication of your right eye.  Take prednisone and Valtrex as prescribed until finished.  Follow-up with your primary care doctor for recheck in 1 week.

## 2022-09-29 ENCOUNTER — Encounter (HOSPITAL_BASED_OUTPATIENT_CLINIC_OR_DEPARTMENT_OTHER): Payer: Self-pay

## 2022-09-29 ENCOUNTER — Emergency Department (HOSPITAL_BASED_OUTPATIENT_CLINIC_OR_DEPARTMENT_OTHER)
Admission: EM | Admit: 2022-09-29 | Discharge: 2022-09-29 | Disposition: A | Discharge status: Home / Self Care | Payer: Medicaid Other | Attending: Emergency Medicine | Admitting: Emergency Medicine

## 2022-09-29 ENCOUNTER — Other Ambulatory Visit: Payer: Self-pay

## 2022-09-29 ENCOUNTER — Emergency Department (HOSPITAL_BASED_OUTPATIENT_CLINIC_OR_DEPARTMENT_OTHER): Payer: Medicaid Other

## 2022-09-29 DIAGNOSIS — N12 Tubulo-interstitial nephritis, not specified as acute or chronic: Secondary | ICD-10-CM | POA: Insufficient documentation

## 2022-09-29 DIAGNOSIS — R109 Unspecified abdominal pain: Secondary | ICD-10-CM | POA: Diagnosis present

## 2022-09-29 DIAGNOSIS — R251 Tremor, unspecified: Secondary | ICD-10-CM | POA: Insufficient documentation

## 2022-09-29 DIAGNOSIS — R000 Tachycardia, unspecified: Secondary | ICD-10-CM | POA: Insufficient documentation

## 2022-09-29 DIAGNOSIS — D72829 Elevated white blood cell count, unspecified: Secondary | ICD-10-CM | POA: Diagnosis not present

## 2022-09-29 DIAGNOSIS — R519 Headache, unspecified: Secondary | ICD-10-CM | POA: Diagnosis not present

## 2022-09-29 LAB — COMPREHENSIVE METABOLIC PANEL
ALT: 21 U/L (ref 0–44)
AST: 20 U/L (ref 15–41)
Albumin: 3.2 g/dL — ABNORMAL LOW (ref 3.5–5.0)
Alkaline Phosphatase: 65 U/L (ref 38–126)
Anion gap: 6 (ref 5–15)
BUN: 10 mg/dL (ref 6–20)
CO2: 26 mmol/L (ref 22–32)
Calcium: 8 mg/dL — ABNORMAL LOW (ref 8.9–10.3)
Chloride: 101 mmol/L (ref 98–111)
Creatinine, Ser: 1.01 mg/dL — ABNORMAL HIGH (ref 0.44–1.00)
GFR, Estimated: >60 mL/min (ref >60)
Glucose, Bld: 103 mg/dL — ABNORMAL HIGH (ref 70–99)
Potassium: 3 mmol/L — ABNORMAL LOW (ref 3.5–5.1)
Sodium: 133 mmol/L — ABNORMAL LOW (ref 135–145)
Total Bilirubin: 0.8 mg/dL (ref 0.3–1.2)
Total Protein: 6.6 g/dL (ref 6.5–8.1)

## 2022-09-29 LAB — CBC WITH DIFFERENTIAL/PLATELET
Abs Immature Granulocytes: 0.05 10*3/uL (ref 0.00–0.07)
Basophils Absolute: 0 10*3/uL (ref 0.0–0.1)
Basophils Relative: 0 %
Eosinophils Absolute: 0 10*3/uL (ref 0.0–0.5)
Eosinophils Relative: 0 %
HCT: 34.6 % — ABNORMAL LOW (ref 36.0–46.0)
Hemoglobin: 11.9 g/dL — ABNORMAL LOW (ref 12.0–15.0)
Immature Granulocytes: 0 %
Lymphocytes Relative: 18 %
Lymphs Abs: 2.1 10*3/uL (ref 0.7–4.0)
MCH: 29.3 pg (ref 26.0–34.0)
MCHC: 34.4 g/dL (ref 30.0–36.0)
MCV: 85.2 fL (ref 80.0–100.0)
Monocytes Absolute: 0.9 10*3/uL (ref 0.1–1.0)
Monocytes Relative: 8 %
Neutro Abs: 8.6 10*3/uL — ABNORMAL HIGH (ref 1.7–7.7)
Neutrophils Relative %: 74 %
Platelets: 361 10*3/uL (ref 150–400)
RBC: 4.06 MIL/uL (ref 3.87–5.11)
RDW: 13.7 % (ref 11.5–15.5)
WBC: 11.7 10*3/uL — ABNORMAL HIGH (ref 4.0–10.5)
nRBC: 0 % (ref 0.0–0.2)

## 2022-09-29 LAB — URINALYSIS, ROUTINE W REFLEX MICROSCOPIC
Bilirubin Urine: NEGATIVE
Glucose, UA: NEGATIVE mg/dL
Ketones, ur: NEGATIVE mg/dL
Nitrite: POSITIVE — AB
Protein, ur: 100 mg/dL — AB
Specific Gravity, Urine: 1.02 (ref 1.005–1.030)
pH: 6 (ref 5.0–8.0)

## 2022-09-29 LAB — URINALYSIS, MICROSCOPIC (REFLEX): WBC, UA: >50 WBC/hpf (ref 0–5)

## 2022-09-29 LAB — PREGNANCY, URINE: Preg Test, Ur: NEGATIVE

## 2022-09-29 MED ORDER — ONDANSETRON 4 MG PO TBDP: 4.0000 mg | ORAL_TABLET | Freq: Three times a day (TID) | ORAL | 0 refills | Status: AC | PRN

## 2022-09-29 MED ORDER — ACETAMINOPHEN 325 MG PO TABS
325.0000 mg | ORAL_TABLET | Freq: Once | ORAL | Status: AC
  Administered 2022-09-29: 325 mg via ORAL
  Filled 2022-09-29: qty 1

## 2022-09-29 MED ORDER — SODIUM CHLORIDE 0.9 % IV SOLN
1.0000 g | Freq: Once | INTRAVENOUS | Status: AC
  Administered 2022-09-29: 1 g via INTRAVENOUS
  Filled 2022-09-29: qty 10

## 2022-09-29 MED ORDER — SODIUM CHLORIDE 0.9 % IV BOLUS
1000.0000 mL | Freq: Once | INTRAVENOUS | Status: AC
  Administered 2022-09-29: 1000 mL via INTRAVENOUS

## 2022-09-29 MED ORDER — POTASSIUM CHLORIDE CRYS ER 20 MEQ PO TBCR
40.0000 meq | EXTENDED_RELEASE_TABLET | Freq: Once | ORAL | Status: AC
  Administered 2022-09-29: 40 meq via ORAL
  Filled 2022-09-29: qty 2

## 2022-09-29 MED ORDER — KETOROLAC TROMETHAMINE 30 MG/ML IJ SOLN
30.0000 mg | Freq: Once | INTRAMUSCULAR | Status: AC
  Administered 2022-09-29: 30 mg via INTRAVENOUS
  Filled 2022-09-29: qty 1

## 2022-09-29 MED ORDER — CEPHALEXIN 500 MG PO CAPS: 500.0000 mg | ORAL_CAPSULE | Freq: Four times a day (QID) | ORAL | 0 refills | Status: AC

## 2022-09-29 MED ORDER — ACETAMINOPHEN 325 MG PO TABS
650.0000 mg | ORAL_TABLET | Freq: Once | ORAL | Status: AC
  Administered 2022-09-29: 650 mg via ORAL
  Filled 2022-09-29: qty 2

## 2022-09-29 NOTE — ED Triage Notes (Signed)
Pt arrives with c/o right sided flank pain that started yesterday. Pt endorses nausea and urinary frequency. Pt denies dysuria or fevers. Pt endorses headaches also.

## 2022-09-29 NOTE — ED Notes (Addendum)
D/c paperwork reviewed with pt, including prescriptions and follow up care.  All questions and/or concerns addressed at time of d/c.  No further needs expressed. . Pt verbalized understanding, Ambulatory without assistance to ED exit, NAD.  Pt educated on importance of monitoring oral temp. Pt verbalized understanding.

## 2022-09-29 NOTE — ED Provider Notes (Signed)
[facility] Provider Note   CSN: [id number] Arrival date & time: 09/29/22  1507     History {Add pertinent medical, surgical, social history, OB history to HPI:1} Chief Complaint  Patient presents with   Flank Pain    Ariana Lawrence is a 22 y.o. female.  Has no significant past medical history.  She has had some shaking chills headache and right flank pain that is been going on for few days.  It has been associated with some urinary frequency.  Nausea.  She does not think she has had a fever.  She went to her primary care doctor today who recommended she come here as this may represent a gallbladder attack.  Last menstrual period was last week.  No vaginal discharge.  No cough or shortness of breath.  The history is provided by the patient.  Flank Pain This is a new problem. The current episode started 2 days ago. The problem occurs constantly. The problem has not changed since onset.Associated symptoms include headaches. Pertinent negatives include no chest pain, no abdominal pain and no shortness of breath. The symptoms are aggravated by bending and twisting. Nothing relieves the symptoms. She has tried rest for the symptoms. The treatment provided no relief.       Home Medications Prior to Admission medications   Medication Sig Start Date End Date Taking? Authorizing Provider  acetaminophen (TYLENOL) 500 MG tablet Take 2 tablets (1,000 mg total) by mouth every 6 (six) hours. 07/10/21   Leftwich-Kirby, Kathie Dike, CNM  ibuprofen (ADVIL) 600 MG tablet Take 1 tablet (600 mg total) by mouth every 6 (six) hours. 07/10/21   Leftwich-Kirby, Kathie Dike, CNM  oxyCODONE (OXY IR/ROXICODONE) 5 MG immediate release tablet Take 1-2 tablets (5-10 mg total) by mouth every 6 (six) hours as needed for severe pain. 07/10/21   Leftwich-Kirby, Kathie Dike, CNM  predniSONE (DELTASONE) 20 MG tablet Take 3 tablets (60 mg total) by mouth daily for 4 days, THEN 2 tablets (40 mg  total) daily for 2 days, THEN 1 tablet (20 mg total) daily for 2 days, THEN 0.5 tablets (10 mg total) daily for 2 days. 09/21/22 10/01/22  Antonietta Breach, PA-C  valACYclovir (VALTREX) 1000 MG tablet Take 1 tablet (1,000 mg total) by mouth 3 (three) times daily. 09/21/22   Antonietta Breach, PA-C  loratadine (CLARITIN) 10 MG tablet Take 1 tablet (10 mg total) by mouth daily. 03/09/20 04/11/20  Raylene Everts, MD      Allergies    Patient has no known allergies.    Review of Systems   Review of Systems  Constitutional:  Positive for chills. Negative for fever.  Eyes:  Negative for visual disturbance.  Respiratory:  Negative for shortness of breath.   Cardiovascular:  Negative for chest pain.  Gastrointestinal:  Positive for nausea. Negative for abdominal pain, diarrhea and vomiting.  Genitourinary:  Positive for flank pain and frequency.  Musculoskeletal:  Positive for back pain. Negative for gait problem.  Neurological:  Positive for headaches.    Physical Exam Updated Vital Signs BP 134/77 (BP Location: Left Arm)   Pulse (!) 105   Temp 98.8 F (37.1 C)   Resp 18   Ht 6' (1.829 m)   Wt (!) 136.5 kg   LMP 09/18/2022   SpO2 100%   BMI 40.82 kg/m  Physical Exam Vitals and nursing note reviewed.  Constitutional:      General: She is not in acute distress.  Appearance: Normal appearance. She is well-developed.  HENT:     Head: Normocephalic and atraumatic.  Eyes:     Conjunctiva/sclera: Conjunctivae normal.  Cardiovascular:     Rate and Rhythm: Regular rhythm. Tachycardia present.     Heart sounds: No murmur heard. Pulmonary:     Effort: Pulmonary effort is normal. No respiratory distress.     Breath sounds: Normal breath sounds.  Abdominal:     Palpations: Abdomen is soft.     Tenderness: There is no abdominal tenderness. There is no guarding or rebound.  Musculoskeletal:        General: No deformity.     Cervical back: Neck supple.  Skin:    General: Skin is warm and dry.      Capillary Refill: Capillary refill takes less than 2 seconds.  Neurological:     General: No focal deficit present.     Mental Status: She is alert.     ED Results / Procedures / Treatments   Labs (all labs ordered are listed, but only abnormal results are displayed) Labs Reviewed  CBC WITH DIFFERENTIAL/PLATELET - Abnormal; Notable for the following components:      Result Value   WBC 11.7 (*)    Hemoglobin 11.9 (*)    HCT 34.6 (*)    Neutro Abs 8.6 (*)    All other components within normal limits  URINALYSIS, ROUTINE W REFLEX MICROSCOPIC - Abnormal; Notable for the following components:   Color, Urine BROWN (*)    APPearance CLOUDY (*)    Hgb urine dipstick LARGE (*)    Protein, ur 100 (*)    Nitrite POSITIVE (*)    Leukocytes,Ua LARGE (*)    All other components within normal limits  URINALYSIS, MICROSCOPIC (REFLEX) - Abnormal; Notable for the following components:   Bacteria, UA MANY (*)    All other components within normal limits  URINE CULTURE  COMPREHENSIVE METABOLIC PANEL  PREGNANCY, URINE    EKG None  Radiology No results found.  Procedures Procedures  {Document cardiac monitor, telemetry assessment procedure when appropriate:1}  Medications Ordered in ED Medications  sodium chloride 0.9 % bolus 1,000 mL (has no administration in time range)  cefTRIAXone (ROCEPHIN) 1 g in sodium chloride 0.9 % 100 mL IVPB (has no administration in time range)    ED Course/ Medical Decision Making/ A&P   {   Click here for ABCD2, HEART and other calculatorsREFRESH Note before signing :1}                          Medical Decision Making Amount and/or Complexity of Data Reviewed Labs: ordered.   This patient complains of ***; this involves an extensive number of treatment Options and is a complaint that carries with it a high risk of complications and morbidity. The differential includes ***  I ordered, reviewed and interpreted labs, which included *** I  ordered medication *** and reviewed PMP when indicated. I ordered imaging studies which included *** and I independently    visualized and interpreted imaging which showed *** Additional history obtained from *** Previous records obtained and reviewed *** I consulted *** and discussed lab and imaging findings and discussed disposition.  Cardiac monitoring reviewed, *** Social determinants considered, *** Critical Interventions: ***  After the interventions stated above, I reevaluated the patient and found *** Admission and further testing considered, ***   {Document critical care time when appropriate:1} {Document review of labs and clinical decision  tools ie heart score, Chads2Vasc2 etc:1}  {Document your independent review of radiology images, and any outside records:1} {Document your discussion with family members, caretakers, and with consultants:1} {Document social determinants of health affecting pt's care:1} {Document your decision making why or why not admission, treatments were needed:1} Final Clinical Impression(s) / ED Diagnoses Final diagnoses:  None    Rx / DC Orders ED Discharge Orders     None

## 2022-09-29 NOTE — Discharge Instructions (Signed)
You were seen in the emergency department for right-sided flank pain and chills.  It appears that you have a urinary tract infection and this is likely cause of your symptoms.  You are given IV antibiotics and fluids and are being sent home with a prescription for antibiotics and nausea medication.  You can continue to use Tylenol and ibuprofen alternating every 4 hours.  Follow-up with your regular doctor.  Return to the emergency department if any worsening or concerning symptoms

## 2022-09-29 NOTE — ED Notes (Signed)
Pt ambulatory to bathroom, no assistance needed.  

## 2022-09-29 NOTE — ED Notes (Signed)
Lab notified to add-on urine culture to previously collected urine sample.  

## 2022-09-30 ENCOUNTER — Encounter: Payer: Medicaid Other | Admitting: Primary Care

## 2022-09-30 NOTE — Patient Instructions (Signed)

## 2022-09-30 NOTE — Progress Notes (Signed)
 This encounter was created in error - please disregard.

## 2022-10-01 LAB — URINE CULTURE: Culture: >=100000 — AB

## 2022-10-02 ENCOUNTER — Telehealth (HOSPITAL_BASED_OUTPATIENT_CLINIC_OR_DEPARTMENT_OTHER): Payer: Self-pay | Admitting: *Deleted

## 2022-10-02 NOTE — Telephone Encounter (Signed)
Post ED Visit - Positive Culture Follow-up  Culture report reviewed by antimicrobial stewardship pharmacist: [facility] Team []  Elenor Quinones, Pharm.D. []  Heide Guile, Pharm.D., BCPS AQ-ID []  Parks Neptune, Pharm.D., BCPS []  Alycia Rossetti, Pharm.D., BCPS []  [place], Pharm.D., BCPS, AAHIVP []  Legrand Como, Pharm.D., BCPS, AAHIVP []  Salome Arnt, PharmD, BCPS []  Johnnette Gourd, PharmD, BCPS []  Hughes Better, PharmD, BCPS []  Leeroy Cha, PharmD []  Laqueta Linden, PharmD, BCPS [x]  Esmeralda Arthur, PharmD  [facility] []  Leodis Sias, PharmD []  Lindell Spar, PharmD []  Royetta Asal, PharmD []  Graylin Shiver, Rph []  Rema Fendt) Glennon Mac, PharmD []  Arlyn Dunning, PharmD []  Netta Cedars, PharmD []  Dia Sitter, PharmD []  Leone Haven, PharmD []  Gretta Arab, PharmD []  Theodis Shove, PharmD []  Peggyann Juba, PharmD []  Reuel Boom, PharmD   Positive urine culture Treated with Cephalexin, organism sensitive to the same and no further patient follow-up is required at this time.  Ariana Lawrence 10/02/2022, 11:47 AM

## 2022-10-08 ENCOUNTER — Encounter: Payer: Medicaid Other | Admitting: Primary Care

## 2022-10-08 NOTE — Patient Instructions (Signed)

## 2022-10-08 NOTE — Progress Notes (Signed)
 This encounter was created in error - please disregard.

## 2022-11-24 ENCOUNTER — Telehealth: Payer: Self-pay | Admitting: Obstetrics and Gynecology

## 2022-11-24 ENCOUNTER — Ambulatory Visit: Payer: Medicaid Other | Admitting: Obstetrics and Gynecology

## 2022-11-24 NOTE — Telephone Encounter (Signed)
Called and left patient a detailed message about appt change the provider will not be in today and asked her to call so we can reschedule

## 2023-04-01 ENCOUNTER — Other Ambulatory Visit: Payer: Self-pay

## 2023-04-01 ENCOUNTER — Encounter (HOSPITAL_COMMUNITY): Payer: Self-pay

## 2023-04-01 ENCOUNTER — Emergency Department (HOSPITAL_COMMUNITY)
Admission: EM | Admit: 2023-04-01 | Discharge: 2023-04-01 | Disposition: A | Discharge status: Home / Self Care | Payer: Medicaid Other | Attending: Emergency Medicine | Admitting: Emergency Medicine

## 2023-04-01 DIAGNOSIS — R600 Localized edema: Secondary | ICD-10-CM | POA: Diagnosis present

## 2023-04-01 LAB — BASIC METABOLIC PANEL
Anion gap: 6 (ref 5–15)
BUN: 8 mg/dL (ref 6–20)
CO2: 27 mmol/L (ref 22–32)
Calcium: 8.7 mg/dL — ABNORMAL LOW (ref 8.9–10.3)
Chloride: 107 mmol/L (ref 98–111)
Creatinine, Ser: 0.91 mg/dL (ref 0.44–1.00)
GFR, Estimated: >60 mL/min (ref >60)
Glucose, Bld: 114 mg/dL — ABNORMAL HIGH (ref 70–99)
Potassium: 3.7 mmol/L (ref 3.5–5.1)
Sodium: 140 mmol/L (ref 135–145)

## 2023-04-01 LAB — CBC WITH DIFFERENTIAL/PLATELET
Abs Immature Granulocytes: 0.04 10*3/uL (ref 0.00–0.07)
Basophils Absolute: 0.1 10*3/uL (ref 0.0–0.1)
Basophils Relative: 1 %
Eosinophils Absolute: 0.4 10*3/uL (ref 0.0–0.5)
Eosinophils Relative: 4 %
HCT: 36.8 % (ref 36.0–46.0)
Hemoglobin: 12.3 g/dL (ref 12.0–15.0)
Immature Granulocytes: 0 %
Lymphocytes Relative: 37 %
Lymphs Abs: 3.5 10*3/uL (ref 0.7–4.0)
MCH: 28.9 pg (ref 26.0–34.0)
MCHC: 33.4 g/dL (ref 30.0–36.0)
MCV: 86.4 fL (ref 80.0–100.0)
Monocytes Absolute: 0.6 10*3/uL (ref 0.1–1.0)
Monocytes Relative: 6 %
Neutro Abs: 4.9 10*3/uL (ref 1.7–7.7)
Neutrophils Relative %: 52 %
Platelets: 382 10*3/uL (ref 150–400)
RBC: 4.26 MIL/uL (ref 3.87–5.11)
RDW: 13.7 % (ref 11.5–15.5)
WBC: 9.4 10*3/uL (ref 4.0–10.5)
nRBC: 0 % (ref 0.0–0.2)

## 2023-04-01 MED ORDER — FUROSEMIDE 20 MG PO TABS: 20.0000 mg | ORAL_TABLET | Freq: Every day | ORAL | 0 refills | Status: AC

## 2023-04-01 NOTE — ED Triage Notes (Signed)
Pt complaining of bilateral ankle swelling and hurting to walk. Denies any injury.

## 2023-04-01 NOTE — Discharge Instructions (Addendum)
Thank you for letting us treat you today. After reviewing your lab. I feel you are safe to go home. Please follow up with your PCP in the next several days and provide them with your records from this visit. Return to the Emergency Room if pain becomes severe or symptoms worsen.

## 2023-04-01 NOTE — ED Provider Notes (Signed)
[facility] AT [facility] Provider Note   CSN: [id number] Arrival date & time: 04/01/23  1951     History  Chief Complaint  Patient presents with   Joint Swelling    ARDEEN NOSKO is a 22 y.o. female who reports to the ED with 2 weeks of bilateral ankle/foot swelling and pain.  She reports 2 weeks of swelling and pain with no known trauma.  She works as a [employer] examiner.  She has taken ibuprofen which has helped with the pain.  She has tried icing and elevating her feet which she feels like has not helped.  She denies wearing compression socks.   HPI     Home Medications Prior to Admission medications   Medication Sig Start Date End Date Taking? Authorizing Provider  furosemide (LASIX) 20 MG tablet Take 1 tablet (20 mg total) by mouth daily for 7 days. 04/01/23 04/08/23 Yes Dolphus Jenny, PA-C  acetaminophen (TYLENOL) 500 MG tablet Take 2 tablets (1,000 mg total) by mouth every 6 (six) hours. 07/10/21   Leftwich-Kirby, Wilmer Floor, CNM  cephALEXin (KEFLEX) 500 MG capsule Take 1 capsule (500 mg total) by mouth 4 (four) times daily. 09/29/22   Terrilee Files, MD  ibuprofen (ADVIL) 600 MG tablet Take 1 tablet (600 mg total) by mouth every 6 (six) hours. 07/10/21   Leftwich-Kirby, Wilmer Floor, CNM  ondansetron (ZOFRAN-ODT) 4 MG disintegrating tablet Take 1 tablet (4 mg total) by mouth every 8 (eight) hours as needed for nausea or vomiting. 09/29/22   Terrilee Files, MD  oxyCODONE (OXY IR/ROXICODONE) 5 MG immediate release tablet Take 1-2 tablets (5-10 mg total) by mouth every 6 (six) hours as needed for severe pain. 07/10/21   Leftwich-Kirby, Wilmer Floor, CNM  valACYclovir (VALTREX) 1000 MG tablet Take 1 tablet (1,000 mg total) by mouth 3 (three) times daily. 09/21/22   Antony Madura, PA-C  loratadine (CLARITIN) 10 MG tablet Take 1 tablet (10 mg total) by mouth daily. 03/09/20 04/11/20  Eustace Moore, MD      Allergies    Patient has no known allergies.     Review of Systems   Review of Systems  Constitutional:  Negative for fever.  Respiratory:  Negative for shortness of breath.   Cardiovascular:  Negative for chest pain.  Musculoskeletal:  Positive for arthralgias and joint swelling.    Physical Exam Updated Vital Signs BP (!) 122/59   Pulse 81   Temp 98.5 F (36.9 C) (Oral)   Resp 16   Ht 6' (1.829 m)   Wt (!) 145.2 kg   LMP 03/28/2023   SpO2 100%   BMI 43.40 kg/m  Physical Exam Vitals and nursing note reviewed.  Constitutional:      General: She is not in acute distress.    Appearance: She is well-developed. She is not toxic-appearing.  HENT:     Head: Normocephalic and atraumatic.  Eyes:     Conjunctiva/sclera: Conjunctivae normal.  Cardiovascular:     Rate and Rhythm: Normal rate and regular rhythm.     Heart sounds: No murmur heard. Pulmonary:     Effort: Pulmonary effort is normal. No respiratory distress.     Breath sounds: Normal breath sounds.  Abdominal:     Palpations: Abdomen is soft.     Tenderness: There is no abdominal tenderness.  Musculoskeletal:        General: Swelling present.     Cervical back: Neck supple.  Comments: Bilateral ankle and dorsum of foot edema Bilateral dorsalis pedis pulse present   Skin:    General: Skin is warm and dry.  Neurological:     General: No focal deficit present.     Mental Status: She is alert.  Psychiatric:        Mood and Affect: Mood normal.     ED Results / Procedures / Treatments   Labs (all labs ordered are listed, but only abnormal results are displayed) Labs Reviewed  BASIC METABOLIC PANEL - Abnormal; Notable for the following components:      Result Value   Glucose, Bld 114 (*)    Calcium 8.7 (*)    All other components within normal limits  CBC WITH DIFFERENTIAL/PLATELET    EKG None  Radiology No results found.  Procedures Procedures    Medications Ordered in ED Medications - No data to display  ED Course/ Medical Decision  Making/ A&P                                 Medical Decision Making Amount and/or Complexity of Data Reviewed Labs: ordered.  This patient presents to the ED with chief complaint(s) of bilateral ankle/foot swelling and pain with pertinent past medical history of none which further complicates the presenting complaint. The complaint involves an extensive differential diagnosis and also carries with it a high risk of complications and morbidity.    The differential diagnosis includes peripheral edema   ED Course and Reassessment:   Independent labs interpretation:  The following labs were independently interpreted: CBC and BMP which were unremarkable.   Consultation: - Consulted or discussed management/test interpretation w/ external professional: None  Consideration for admission or further workup: Patient's vital signs stable throughout ER visit          Final Clinical Impression(s) / ED Diagnoses Final diagnoses:  Bilateral lower extremity edema    Rx / DC Orders ED Discharge Orders          Ordered    furosemide (LASIX) 20 MG tablet  Daily        04/01/23 2319              Dolphus Jenny, PA-C 04/01/23 2319    Eber Hong, MD 04/02/23 [phone]

## 2023-05-29 ENCOUNTER — Ambulatory Visit: Payer: Medicaid Other

## 2023-05-31 ENCOUNTER — Ambulatory Visit: Payer: Self-pay
